# Patient Record
Sex: Female | Born: 1960
Health system: Southern US, Community
[De-identification: ages and names within clinical notes are randomized; demographics above are authoritative.]

## PROBLEM LIST (undated history)

## (undated) HISTORY — PX: TUBAL LIGATION: SHX77

## (undated) HISTORY — PX: GALLBLADDER SURGERY: SHX652

---

## 2002-05-31 ENCOUNTER — Ambulatory Visit (HOSPITAL_BASED_OUTPATIENT_CLINIC_OR_DEPARTMENT_OTHER): Admission: RE | Admit: 2002-05-31 | Discharge: 2002-05-31 | Payer: Self-pay | Admitting: General Surgery

## 2013-01-19 DIAGNOSIS — Z0289 Encounter for other administrative examinations: Secondary | ICD-10-CM

## 2013-01-26 ENCOUNTER — Telehealth: Payer: Self-pay | Admitting: Nurse Practitioner

## 2013-01-29 ENCOUNTER — Telehealth: Payer: Self-pay | Admitting: Nurse Practitioner

## 2013-01-29 NOTE — Telephone Encounter (Signed)
Derry Skill is working on

## 2013-02-01 NOTE — Telephone Encounter (Signed)
Heather Mcclain  finiched and pt picked up on 02/01/13

## 2013-02-26 ENCOUNTER — Telehealth: Payer: Self-pay | Admitting: Nurse Practitioner

## 2013-03-01 NOTE — Telephone Encounter (Signed)
Mmm has chart

## 2013-03-01 NOTE — Telephone Encounter (Signed)
Mmm advise 

## 2013-03-01 NOTE — Telephone Encounter (Signed)
Cannot find- what is patient name- is it [place]?- Pulled [facility]aya chart and there is no form on it.

## 2013-12-01 ENCOUNTER — Ambulatory Visit (INDEPENDENT_AMBULATORY_CARE_PROVIDER_SITE_OTHER): Payer: BC Managed Care – PPO | Admitting: Family Medicine

## 2013-12-01 ENCOUNTER — Telehealth: Payer: Self-pay | Admitting: Nurse Practitioner

## 2013-12-01 ENCOUNTER — Encounter: Payer: Self-pay | Admitting: Family Medicine

## 2013-12-01 VITALS — BP 128/86 | HR 59 | Temp 98.0°F | Ht 66.0 in | Wt 177.0 lb

## 2013-12-01 DIAGNOSIS — R1011 Right upper quadrant pain: Secondary | ICD-10-CM

## 2013-12-01 DIAGNOSIS — R1031 Right lower quadrant pain: Secondary | ICD-10-CM

## 2013-12-01 LAB — POCT CBC
Granulocyte percent: 81.6 %G — AB (ref 37–80)
HCT, POC: 41.9 % (ref 37.7–47.9)
Hemoglobin: 13.3 g/dL (ref 12.2–16.2)
Lymph, poc: 1.8 (ref 0.6–3.4)
MCH, POC: 26 pg — AB (ref 27–31.2)
MCHC: 31.7 g/dL — AB (ref 31.8–35.4)
MCV: 81.8 fL (ref 80–97)
MPV: 8.9 fL (ref 0–99.8)
POC Granulocyte: 9.4 — AB (ref 2–6.9)
POC LYMPH PERCENT: 15.7 %L (ref 10–50)
Platelet Count, POC: 168 10*3/uL (ref 142–424)
RBC: 5.1 M/uL (ref 4.04–5.48)
RDW, POC: 14.7 %
WBC: 11.5 10*3/uL — AB (ref 4.6–10.2)

## 2013-12-01 LAB — POCT URINALYSIS DIPSTICK
Bilirubin, UA: NEGATIVE
Glucose, UA: NEGATIVE
Ketones, UA: NEGATIVE
Leukocytes, UA: NEGATIVE
Nitrite, UA: NEGATIVE
Spec Grav, UA: <=1.005
Urobilinogen, UA: NEGATIVE
pH, UA: 8.5

## 2013-12-01 LAB — POCT UA - MICROSCOPIC ONLY
Casts, Ur, LPF, POC: NEGATIVE
Crystals, Ur, HPF, POC: NEGATIVE
Mucus, UA: NEGATIVE
Yeast, UA: NEGATIVE

## 2013-12-01 NOTE — Telephone Encounter (Signed)
appt given for 5:45 but advised her to come on in case we need to do xray

## 2013-12-01 NOTE — Progress Notes (Signed)
Subjective:    Patient ID: Heather Mcclain, female    DOB: 04-Oct-1960, 53 y.o.   MRN: [id number]  HPI C/o right upper quadrant abdominal pain that has been severe today.  She has had nausea with this but has not vomitted.  She has been having intermittant TUQ abdominal discomfort for the last few months.  She is having pain on a severe rating.  C/o nausea and denies dysuria   Review of Systems C/o abdominal pain and nausea No chest pain, SOB, HA, dizziness, vision change, N/V, diarrhea, constipation, dysuria, urinary urgency or frequency, myalgias, arthralgias or rash.     Objective:   Physical Exam   Vital signs noted  Well developed well nourished female.  HEENT - Head atraumatic Normocephalic                Eyes - PERRLA, Conjuctiva - clear Sclera- Clear EOMI                Ears - EAC's Wnl TM's Wnl Gross Hearing WNL                Throat - oropharanx wnl Respiratory - Lungs CTA bilateral Cardiac - RRR S1 and S2 without murmur GI - Abdomen soft tender right upper quadrant and positive murphy's and bowel sounds active x 4 Extremities - No edema. Neuro - Grossly intact. MS - Negative CVS tenderness bilateral    Results for orders placed in visit on 12/01/13  POCT UA - MICROSCOPIC ONLY      Result Value Ref Range   WBC, Ur, HPF, POC 5-8     RBC, urine, microscopic 3-5     Bacteria, U Microscopic moderate     Mucus, UA negative     Epithelial cells, urine per micros few     Crystals, Ur, HPF, POC negative     Casts, Ur, LPF, POC negative     Yeast, UA negative    POCT URINALYSIS DIPSTICK      Result Value Ref Range   Color, UA gold     Clarity, UA clear     Glucose, UA negative     Bilirubin, UA negative     Ketones, UA negative     Spec Grav, UA <=1.005     Blood, UA trace     pH, UA 8.5     Protein, UA 3+     Urobilinogen, UA negative     Nitrite, UA negative     Leukocytes, UA Negative    POCT CBC      Result Value Ref Range   WBC 11.5 (*) 4.6 -  10.2 K/uL   Lymph, poc 1.8  0.6 - 3.4   POC LYMPH PERCENT 15.7  10 - 50 %L   POC Granulocyte 9.4 (*) 2 - 6.9   Granulocyte percent 81.6 (*) 37 - 80 %G   RBC 5.1  4.04 - 5.48 M/uL   Hemoglobin 13.3  12.2 - 16.2 g/dL   HCT, POC 94.5  85.9 - 47.9 %   MCV 81.8  80 - 97 fL   MCH, POC 26.0 (*) 27 - 31.2 pg   MCHC 31.7 (*) 31.8 - 35.4 g/dL   RDW, POC 29.2     Platelet Count, POC 168.0  142 - 424 K/uL   MPV 8.9  0 - 99.8 fL   Assessment & Plan:  Pain, abdominal, RLQ - Plan: POCT UA - Microscopic Only, POCT urinalysis dipstick, POCT CBC  RUQ abdominal  pain - R/o cholycystitis and she will be sent to ED and [facility]  She is advised to follow up prn  Deatra CanterWilliam J Oxford FNP

## 2013-12-02 ENCOUNTER — Telehealth: Payer: Self-pay | Admitting: Family Medicine

## 2013-12-02 ENCOUNTER — Other Ambulatory Visit: Payer: Self-pay | Admitting: Family Medicine

## 2013-12-02 DIAGNOSIS — K8020 Calculus of gallbladder without cholecystitis without obstruction: Secondary | ICD-10-CM

## 2013-12-02 NOTE — Telephone Encounter (Signed)
Called [facility] they are sending U/S and i will put it on your desk

## 2014-01-20 DIAGNOSIS — Z9049 Acquired absence of other specified parts of digestive tract: Secondary | ICD-10-CM | POA: Insufficient documentation

## 2014-01-20 DIAGNOSIS — K8020 Calculus of gallbladder without cholecystitis without obstruction: Secondary | ICD-10-CM | POA: Insufficient documentation

## 2014-06-15 ENCOUNTER — Ambulatory Visit (INDEPENDENT_AMBULATORY_CARE_PROVIDER_SITE_OTHER): Payer: BC Managed Care – PPO | Admitting: Nurse Practitioner

## 2014-06-15 ENCOUNTER — Encounter: Payer: Self-pay | Admitting: Nurse Practitioner

## 2014-06-15 VITALS — BP 134/84 | HR 67 | Temp 97.5°F | Ht 66.0 in | Wt 180.0 lb

## 2014-06-15 DIAGNOSIS — B353 Tinea pedis: Secondary | ICD-10-CM

## 2014-06-15 MED ORDER — TERBINAFINE HCL 1 % EX CREA: 1.0000 "application " | TOPICAL_CREAM | Freq: Two times a day (BID) | CUTANEOUS | Status: DC

## 2014-06-15 NOTE — Progress Notes (Signed)
   Subjective:    Patient ID: Heather Mcclain, female    DOB: 1960/11/19, 53 y.o.   MRN: [id number]  HPI Patient in today c/o left foot burning and itching- red and peeling- worse when she is walking on it.    Review of Systems  Constitutional: Negative.   HENT: Negative.   Respiratory: Negative.   Cardiovascular: Negative.   Genitourinary: Negative.   Neurological: Negative.   Psychiatric/Behavioral: Negative.   All other systems reviewed and are negative.      Objective:   Physical Exam  Constitutional: She is oriented to person, place, and time. She appears well-developed and well-nourished.  Cardiovascular: Normal rate, regular rhythm and normal heart sounds.   Pulmonary/Chest: Effort normal and breath sounds normal.  Musculoskeletal: Normal range of motion.  Neurological: She is alert and oriented to person, place, and time.  Skin: Skin is warm.  erythemaytous peeling rash between 2nd and 3rd toe left foot.   BP 134/84 mmHg  Pulse 67  Temp(Src) 97.5 F (36.4 C) (Oral)  Ht 5\' 6"  (1.676 m)  Wt 180 lb (81.647 kg)  BMI 29.07 kg/m2        Assessment & Plan:   1. Tinea pedis of left foot    Meds ordered this encounter  Medications  . terbinafine (LAMISIL AT) 1 % cream    Sig: Apply 1 application topically 2 (two) times daily.    Dispense:  30 g    Refill:  0    Order Specific Question:  Supervising Provider    Answer:  Ernestina PennaMOORE, DONALD W [[id number]   Keep area clean and dry Avoid scratching RTO prn   Mary-Margaret Daphine DeutscherMartin, FNP

## 2014-06-15 NOTE — Patient Instructions (Signed)

## 2014-06-28 DIAGNOSIS — Z0289 Encounter for other administrative examinations: Secondary | ICD-10-CM

## 2015-01-04 ENCOUNTER — Encounter: Payer: Self-pay | Admitting: Nurse Practitioner

## 2015-02-10 ENCOUNTER — Encounter: Payer: Self-pay | Admitting: Physician Assistant

## 2015-02-10 ENCOUNTER — Ambulatory Visit (INDEPENDENT_AMBULATORY_CARE_PROVIDER_SITE_OTHER): Payer: BLUE CROSS/BLUE SHIELD | Admitting: Physician Assistant

## 2015-02-10 VITALS — BP 136/88 | HR 72 | Temp 98.7°F | Ht 66.0 in | Wt 181.0 lb

## 2015-02-10 DIAGNOSIS — L089 Local infection of the skin and subcutaneous tissue, unspecified: Secondary | ICD-10-CM | POA: Diagnosis not present

## 2015-02-10 DIAGNOSIS — Z0000 Encounter for general adult medical examination without abnormal findings: Secondary | ICD-10-CM | POA: Diagnosis not present

## 2015-02-10 DIAGNOSIS — L918 Other hypertrophic disorders of the skin: Secondary | ICD-10-CM

## 2015-02-10 NOTE — Progress Notes (Signed)
   Subjective:    Patient ID: Heather Mcclain, female    DOB: Dec 27, 1960, 54 y.o.   MRN: [id number]  HPI 54 y/o female with no comorbidities presents for physical for her work. She has all of her labs done at work. She states that all of her labs were WNL in Sept 2015 but she is unsure of labs that were performed.   She is up to date on mammogram - 2016  She is utd on colonoscopy - 3 years ago  She has pap smear scheduled with Dr. Alveria Apley in Nov 2016.     Review of Systems  Constitutional: Negative.   HENT: Negative.   Eyes: Negative.   Respiratory: Negative.   Cardiovascular: Negative.   Gastrointestinal: Negative.   Skin:       Skin tag on neck, irritated by clothing   Neurological: Negative.   Psychiatric/Behavioral: Negative.        Objective:   Physical Exam  Constitutional: She is oriented to person, place, and time. She appears well-developed and well-nourished.  HENT:  Head: Normocephalic and atraumatic.  Right Ear: External ear normal.  Left Ear: External ear normal.  Cardiovascular: Normal rate, regular rhythm, normal heart sounds and intact distal pulses.  Exam reveals no gallop and no friction rub.   No murmur heard. Pulmonary/Chest: Effort normal and breath sounds normal. No respiratory distress. She has no wheezes. She has no rales. She exhibits no tenderness.  Abdominal: Soft. Bowel sounds are normal. She exhibits no distension and no mass. There is no tenderness. There is no rebound and no guarding.  Musculoskeletal: Normal range of motion. She exhibits no edema or tenderness.  Neurological: She is alert and oriented to person, place, and time. No cranial nerve deficit. Coordination normal.  Skin: No erythema.  Keloid scar on chest   Psychiatric: She has a normal mood and affect. Her behavior is normal. Judgment and thought content normal.  Nursing note and vitals reviewed.         Assessment & Plan:  1. Physical exam, annual  - Follow up  with Dr. Windy Fast for pap smear - Mammogram in 1 year - Colonoscopy in 7 years - bone density  - Bring most recent labs to office or have faxed over  2. Inflamed skin tag <.5cm inflamed skin tag removed. Was not sent for biopsy d/t benign appearing nature. Monsels and bandage applied post procedure  RTO 1 year or sooner if needed   Tiffany A. Chauncey Reading PA-C

## 2015-07-25 DIAGNOSIS — Z029 Encounter for administrative examinations, unspecified: Secondary | ICD-10-CM

## 2016-07-09 DIAGNOSIS — Z029 Encounter for administrative examinations, unspecified: Secondary | ICD-10-CM

## 2017-01-16 ENCOUNTER — Encounter (INDEPENDENT_AMBULATORY_CARE_PROVIDER_SITE_OTHER): Payer: Self-pay

## 2017-01-16 ENCOUNTER — Ambulatory Visit (INDEPENDENT_AMBULATORY_CARE_PROVIDER_SITE_OTHER): Payer: BLUE CROSS/BLUE SHIELD | Admitting: Nurse Practitioner

## 2017-01-16 ENCOUNTER — Encounter: Payer: Self-pay | Admitting: Nurse Practitioner

## 2017-01-16 VITALS — BP 130/87 | HR 73 | Temp 97.5°F | Ht 66.0 in | Wt 194.0 lb

## 2017-01-16 DIAGNOSIS — Z1159 Encounter for screening for other viral diseases: Secondary | ICD-10-CM

## 2017-01-16 DIAGNOSIS — E559 Vitamin D deficiency, unspecified: Secondary | ICD-10-CM

## 2017-01-16 DIAGNOSIS — Z0000 Encounter for general adult medical examination without abnormal findings: Secondary | ICD-10-CM

## 2017-01-16 DIAGNOSIS — Z1211 Encounter for screening for malignant neoplasm of colon: Secondary | ICD-10-CM

## 2017-01-16 LAB — URINALYSIS, COMPLETE
Bilirubin, UA: NEGATIVE
Glucose, UA: NEGATIVE
Ketones, UA: NEGATIVE
Leukocytes, UA: NEGATIVE
NITRITE UA: NEGATIVE
PH UA: 5.5 (ref 5.0–7.5)
PROTEIN UA: NEGATIVE
Specific Gravity, UA: <1.005 — ABNORMAL LOW (ref 1.005–1.030)
UUROB: 0.2 mg/dL (ref 0.2–1.0)

## 2017-01-16 LAB — MICROSCOPIC EXAMINATION

## 2017-01-16 MED ORDER — VITAMIN D (ERGOCALCIFEROL) 1.25 MG (50000 UNIT) PO CAPS: 50000.0000 [IU] | ORAL_CAPSULE | ORAL | 1 refills | Status: DC

## 2017-01-16 NOTE — Patient Instructions (Signed)
Bone Health Bones protect organs, store calcium, and anchor muscles. Good health habits, such as eating nutritious foods and exercising regularly, are important for maintaining healthy bones. They can also help to prevent a condition that causes bones to lose density and become weak and brittle (osteoporosis). Why is bone mass important? Bone mass refers to the amount of bone tissue that you have. The higher your bone mass, the stronger your bones. An important step toward having healthy bones throughout life is to have strong and dense bones during childhood. A young adult who has a high bone mass is more likely to have a high bone mass later in life. Bone mass at its greatest it is called peak bone mass. A large decline in bone mass occurs in older adults. In women, it occurs about the time of menopause. During this time, it is important to practice good health habits, because if more bone is lost than what is replaced, the bones will become less healthy and more likely to break (fracture). If you find that you have a low bone mass, you may be able to prevent osteoporosis or further bone loss by changing your diet and lifestyle. How can I find out if my bone mass is low? Bone mass can be measured with an X-ray test that is called a bone mineral density (BMD) test. This test is recommended for all women who are age 65 or older. It may also be recommended for men who are age 70 or older, or for people who are more likely to develop osteoporosis due to:  Having bones that break easily.  Having a long-term disease that weakens bones, such as kidney disease or rheumatoid arthritis.  Having menopause earlier than normal.  Taking medicine that weakens bones, such as steroids, thyroid hormones, or hormone treatment for breast cancer or prostate cancer.  Smoking.  Drinking three or more alcoholic drinks each day.  What are the nutritional recommendations for healthy bones? To have healthy bones, you  need to get enough of the right minerals and vitamins. Most nutrition experts recommend getting these nutrients from the foods that you eat. Nutritional recommendations vary from person to person. Ask your health care provider what is healthy for you. Here are some general guidelines. Calcium Recommendations Calcium is the most important (essential) mineral for bone health. Most people can get enough calcium from their diet, but supplements may be recommended for people who are at risk for osteoporosis. Good sources of calcium include:  Dairy products, such as low-fat or nonfat milk, cheese, and yogurt.  Dark green leafy vegetables, such as bok choy and broccoli.  Calcium-fortified foods, such as orange juice, cereal, bread, soy beverages, and tofu products.  Nuts, such as almonds.  Follow these recommended amounts for daily calcium intake:  Children, age 1?3: 700 mg.  Children, age 4?8: 1,000 mg.  Children, age 9?13: 1,300 mg.  Teens, age 14?18: 1,300 mg.  Adults, age 19?50: 1,000 mg.  Adults, age 51?70: ? Men: 1,000 mg. ? Women: 1,200 mg.  Adults, age 71 or older: 1,200 mg.  Pregnant and breastfeeding females: ? Teens: 1,300 mg. ? Adults: 1,000 mg.  Vitamin D Recommendations Vitamin D is the most essential vitamin for bone health. It helps the body to absorb calcium. Sunlight stimulates the skin to make vitamin D, so be sure to get enough sunlight. If you live in a cold climate or you do not get outside often, your health care provider may recommend that you take vitamin   D supplements. Good sources of vitamin D in your diet include:  Egg yolks.  Saltwater fish.  Milk and cereal fortified with vitamin D.  Follow these recommended amounts for daily vitamin D intake:  Children and teens, age 1?18: 600 international units.  Adults, age 50 or younger: 400-800 international units.  Adults, age 51 or older: 800-1,000 international units.  Other Nutrients Other nutrients  for bone health include:  Phosphorus. This mineral is found in meat, poultry, dairy foods, nuts, and legumes. The recommended daily intake for adult men and adult women is 700 mg.  Magnesium. This mineral is found in seeds, nuts, dark green vegetables, and legumes. The recommended daily intake for adult men is 400?420 mg. For adult women, it is 310?320 mg.  Vitamin K. This vitamin is found in green leafy vegetables. The recommended daily intake is 120 mg for adult men and 90 mg for adult women.  What type of physical activity is best for building and maintaining healthy bones? Weight-bearing and strength-building activities are important for building and maintaining peak bone mass. Weight-bearing activities cause muscles and bones to work against gravity. Strength-building activities increases muscle strength that supports bones. Weight-bearing and muscle-building activities include:  Walking and hiking.  Jogging and running.  Dancing.  Gym exercises.  Lifting weights.  Tennis and racquetball.  Climbing stairs.  Aerobics.  Adults should get at least 30 minutes of moderate physical activity on most days. Children should get at least 60 minutes of moderate physical activity on most days. Ask your health care provide what type of exercise is best for you. Where can I find more information? For more information, check out the following websites:  National Osteoporosis Foundation: http://nof.org/learn/basics  National Institutes of Health: http://www.niams.nih.gov/Health_Info/Bone/Bone_Health/bone_health_for_life.asp  This information is not intended to replace advice given to you by your health care provider. Make sure you discuss any questions you have with your health care provider. Document Released: 09/14/2003 Document Revised: 01/12/2016 Document Reviewed: 06/29/2014 Elsevier Interactive Patient Education  2018 Elsevier Inc.  

## 2017-01-16 NOTE — Progress Notes (Signed)
   Subjective:    Patient ID: Kristena Wilhelmi, female    DOB: Sep 12, 1960, 56 y.o.   MRN: [id number]  HPI Patient is in the office for her comprehensive physical exam.  Patient does not wish to have a pap today. She has no current medical problems and is on no meds.   Review of Systems  Constitutional: Negative for activity change, appetite change and fatigue.  Respiratory: Negative for cough, shortness of breath and wheezing.   Cardiovascular: Negative for chest pain and palpitations.  Gastrointestinal: Negative for abdominal distention and abdominal pain.  Skin: Negative for rash.  Neurological: Negative for dizziness, light-headedness and headaches.  All other systems reviewed and are negative.      Objective:   Physical Exam  Constitutional: She is oriented to person, place, and time. She appears well-developed and well-nourished. No distress.  HENT:  Head: Normocephalic and atraumatic.  Right Ear: External ear normal.  Left Ear: External ear normal.  Nose: Nose normal.  Mouth/Throat: Oropharynx is clear and moist.  Eyes: Pupils are equal, round, and reactive to light. Conjunctivae and EOM are normal.  Neck: Normal range of motion. Neck supple. No JVD present. No thyromegaly present.  Cardiovascular: Normal rate, regular rhythm, normal heart sounds and intact distal pulses.  Exam reveals no gallop and no friction rub.   No murmur heard. Pulmonary/Chest: Effort normal and breath sounds normal. No respiratory distress. She has no wheezes.  Abdominal: Soft. Bowel sounds are normal. She exhibits no distension and no mass. There is no tenderness.  Genitourinary: Vagina normal.  Musculoskeletal: Normal range of motion. She exhibits no edema.  Lymphadenopathy:    She has no cervical adenopathy.  Neurological: She is alert and oriented to person, place, and time. She has normal reflexes.  Skin: Skin is warm and dry. No rash noted. No erythema.  Psychiatric: She has a normal  mood and affect. Her behavior is normal. Judgment and thought content normal.   BP 130/87   Pulse 73   Temp (!) 97.5 F (36.4 C) (Oral)   Ht '5\' 6"'$  (1.676 m)   Wt 194 lb (88 kg)   BMI 31.31 kg/m      Assessment & Plan:   1. Annual physical exam   2. Vitamin D deficiency   3. Need for hepatitis C screening test   4. Screening for colon cancer    Meds ordered this encounter  Medications  . Vitamin D, Ergocalciferol, (DRISDOL) 50000 units CAPS capsule    Sig: Take 1 capsule (50,000 Units total) by mouth every 7 (seven) days.    Dispense:  12 capsule    Refill:  1    Order Specific Question:   Supervising Provider    Answer:   VINCENT, CAROL L [[id number]]   Orders Placed This Encounter  Procedures  . Fecal occult blood, imunochemical    Standing Status:   Future    Standing Expiration Date:   07/19/2017  . Urinalysis, Complete  . CBC with Differential/Platelet  . CMP14+EGFR  . Lipid panel  . Thyroid Panel With TSH  . VITAMIN D 25 Hydroxy (Vit-D Deficiency, Fractures)  . Hepatitis C antibody   Labs pedning Diet and exercise encouraged Follow up in 3 months  [place], FNP

## 2017-01-17 ENCOUNTER — Other Ambulatory Visit: Payer: Self-pay | Admitting: Nurse Practitioner

## 2017-01-17 LAB — CBC WITH DIFFERENTIAL/PLATELET
BASOS: 0 %
Basophils Absolute: 0 10*3/uL (ref 0.0–0.2)
EOS (ABSOLUTE): 0.1 10*3/uL (ref 0.0–0.4)
EOS: 2 %
HEMATOCRIT: 41.5 % (ref 34.0–46.6)
Hemoglobin: 13.8 g/dL (ref 11.1–15.9)
IMMATURE GRANS (ABS): 0 10*3/uL (ref 0.0–0.1)
IMMATURE GRANULOCYTES: 0 %
LYMPHS: 44 %
Lymphocytes Absolute: 2.5 10*3/uL (ref 0.7–3.1)
MCH: 26.9 pg (ref 26.6–33.0)
MCHC: 33.3 g/dL (ref 31.5–35.7)
MCV: 81 fL (ref 79–97)
MONOCYTES: 11 %
MONOS ABS: 0.6 10*3/uL (ref 0.1–0.9)
NEUTROS PCT: 43 %
Neutrophils Absolute: 2.4 10*3/uL (ref 1.4–7.0)
PLATELETS: 210 10*3/uL (ref 150–379)
RBC: 5.13 x10E6/uL (ref 3.77–5.28)
RDW: 16 % — AB (ref 12.3–15.4)
WBC: 5.6 10*3/uL (ref 3.4–10.8)

## 2017-01-17 LAB — THYROID PANEL WITH TSH
Free Thyroxine Index: 1.7 (ref 1.2–4.9)
T3 Uptake Ratio: 23 % — ABNORMAL LOW (ref 24–39)
T4, Total: 7.6 ug/dL (ref 4.5–12.0)
TSH: 2.05 u[IU]/mL (ref 0.450–4.500)

## 2017-01-17 LAB — VITAMIN D 25 HYDROXY (VIT D DEFICIENCY, FRACTURES): Vit D, 25-Hydroxy: 21.3 ng/mL — ABNORMAL LOW (ref 30.0–100.0)

## 2017-01-17 LAB — CMP14+EGFR
ALT: 22 IU/L (ref 0–32)
AST: 22 IU/L (ref 0–40)
Albumin/Globulin Ratio: 1.6 (ref 1.2–2.2)
Albumin: 4.4 g/dL (ref 3.5–5.5)
Alkaline Phosphatase: 106 IU/L (ref 39–117)
BUN/Creatinine Ratio: 9 (ref 9–23)
BUN: 7 mg/dL (ref 6–24)
Bilirubin Total: 0.3 mg/dL (ref 0.0–1.2)
CALCIUM: 10.1 mg/dL (ref 8.7–10.2)
CO2: 27 mmol/L (ref 20–29)
CREATININE: 0.82 mg/dL (ref 0.57–1.00)
Chloride: 103 mmol/L (ref 96–106)
GFR, EST AFRICAN AMERICAN: 93 mL/min/{1.73_m2} (ref >59)
GFR, EST NON AFRICAN AMERICAN: 81 mL/min/{1.73_m2} (ref >59)
GLOBULIN, TOTAL: 2.8 g/dL (ref 1.5–4.5)
Glucose: 88 mg/dL (ref 65–99)
Potassium: 5 mmol/L (ref 3.5–5.2)
Sodium: 144 mmol/L (ref 134–144)
TOTAL PROTEIN: 7.2 g/dL (ref 6.0–8.5)

## 2017-01-17 LAB — LIPID PANEL
CHOL/HDL RATIO: 2.1 ratio (ref 0.0–4.4)
Cholesterol, Total: 184 mg/dL (ref 100–199)
HDL: 87 mg/dL (ref >39)
LDL CALC: 79 mg/dL (ref 0–99)
TRIGLYCERIDES: 90 mg/dL (ref 0–149)
VLDL Cholesterol Cal: 18 mg/dL (ref 5–40)

## 2017-01-17 LAB — HEPATITIS C ANTIBODY

## 2017-06-24 ENCOUNTER — Other Ambulatory Visit: Payer: Self-pay | Admitting: Nurse Practitioner

## 2017-06-24 DIAGNOSIS — E559 Vitamin D deficiency, unspecified: Secondary | ICD-10-CM

## 2017-06-24 NOTE — Telephone Encounter (Signed)
Last Vit D 01/16/17  21.3

## 2017-07-09 DIAGNOSIS — Z0289 Encounter for other administrative examinations: Secondary | ICD-10-CM

## 2017-08-14 NOTE — Progress Notes (Signed)
LMTCB

## 2017-09-30 ENCOUNTER — Other Ambulatory Visit: Payer: Self-pay | Admitting: Nurse Practitioner

## 2017-09-30 DIAGNOSIS — E559 Vitamin D deficiency, unspecified: Secondary | ICD-10-CM

## 2017-12-16 LAB — HM MAMMOGRAPHY

## 2017-12-28 ENCOUNTER — Other Ambulatory Visit: Payer: Self-pay | Admitting: Nurse Practitioner

## 2017-12-28 DIAGNOSIS — E559 Vitamin D deficiency, unspecified: Secondary | ICD-10-CM

## 2018-02-10 NOTE — Progress Notes (Signed)
In Care Everywhere  

## 2019-06-24 ENCOUNTER — Other Ambulatory Visit: Payer: Self-pay

## 2019-06-28 ENCOUNTER — Ambulatory Visit: Payer: BLUE CROSS/BLUE SHIELD

## 2019-06-30 ENCOUNTER — Other Ambulatory Visit: Payer: Self-pay

## 2019-06-30 ENCOUNTER — Encounter: Payer: Self-pay | Admitting: Family Medicine

## 2019-06-30 ENCOUNTER — Ambulatory Visit (INDEPENDENT_AMBULATORY_CARE_PROVIDER_SITE_OTHER): Payer: BC Managed Care – PPO | Admitting: Family Medicine

## 2019-06-30 VITALS — BP 138/76 | HR 63 | Temp 98.0°F | Resp 20 | Ht 66.0 in | Wt 185.0 lb

## 2019-06-30 DIAGNOSIS — Z6829 Body mass index (BMI) 29.0-29.9, adult: Secondary | ICD-10-CM | POA: Diagnosis not present

## 2019-06-30 DIAGNOSIS — H6692 Otitis media, unspecified, left ear: Secondary | ICD-10-CM | POA: Diagnosis not present

## 2019-06-30 DIAGNOSIS — E559 Vitamin D deficiency, unspecified: Secondary | ICD-10-CM

## 2019-06-30 DIAGNOSIS — R8271 Bacteriuria: Secondary | ICD-10-CM

## 2019-06-30 DIAGNOSIS — Z0000 Encounter for general adult medical examination without abnormal findings: Secondary | ICD-10-CM

## 2019-06-30 DIAGNOSIS — Z0001 Encounter for general adult medical examination with abnormal findings: Secondary | ICD-10-CM

## 2019-06-30 LAB — URINALYSIS, COMPLETE
Bilirubin, UA: NEGATIVE
Glucose, UA: NEGATIVE
Ketones, UA: NEGATIVE
Leukocytes,UA: NEGATIVE
Nitrite, UA: NEGATIVE
Protein,UA: NEGATIVE
Specific Gravity, UA: 1.025 (ref 1.005–1.030)
Urobilinogen, Ur: 0.2 mg/dL (ref 0.2–1.0)
pH, UA: 5.5 (ref 5.0–7.5)

## 2019-06-30 LAB — MICROSCOPIC EXAMINATION: Renal Epithel, UA: NONE SEEN /hpf

## 2019-06-30 MED ORDER — VITAMIN D (ERGOCALCIFEROL) 1.25 MG (50000 UNIT) PO CAPS: 50000.0000 [IU] | ORAL_CAPSULE | ORAL | 0 refills | Status: DC

## 2019-06-30 MED ORDER — CEFDINIR 300 MG PO CAPS: 300.0000 mg | ORAL_CAPSULE | Freq: Two times a day (BID) | ORAL | 0 refills | Status: DC

## 2019-06-30 NOTE — Progress Notes (Signed)
Subjective:  Patient ID: Heather Mcclain, female    DOB: July 11, 1960, 58 y.o.   MRN: [id number]  Patient Care Team: Chevis Pretty, [place] as PCP - General (Nurse Practitioner)   Chief Complaint:  Annual Exam (no pap )   HPI: Heather Mcclain is a 58 y.o. female presenting on 06/30/2019 for Annual Exam (no pap )   Pt presents today for her annual physical exam. Pt states she has been doing well overall. States she is only taking over the counter Vit D repletion therapy on a regular basis. She was on once weekly oral repletion but has run out and did not get this refilled. She has had her mammogram and PAP completed. She states she had her colonoscopy completed at [facility] at age 75 and it was normal. She states she does have a keloid scar to her chest. States this occurred after a cyst removal. She has not been putting anything on the scar. She states she has pain in her left ear. States this started about 3 weeks ago and is getting worse. No fever, chills, or drainage. She does report "popping" sounds at times.     Relevant past medical, surgical, family, and social history reviewed and updated as indicated.  Allergies and medications reviewed and updated. Date reviewed: Chart in Epic.   History reviewed. No pertinent past medical history.  History reviewed. No pertinent surgical history.  Social History   Socioeconomic History  . Marital status: Married    Spouse name: Not on file  . Number of children: Not on file  . Years of education: Not on file  . Highest education level: Not on file  Occupational History  . Not on file  Tobacco Use  . Smoking status: Never Smoker  . Smokeless tobacco: Never Used  Substance and Sexual Activity  . Alcohol use: No  . Drug use: No  . Sexual activity: Not on file  Other Topics Concern  . Not on file  Social History Narrative  . Not on file   Social Determinants of Health   Financial Resource Strain:   . Difficulty  of Paying Living Expenses: Not on file  Food Insecurity:   . Worried About Charity fundraiser in the Last Year: Not on file  . Ran Out of Food in the Last Year: Not on file  Transportation Needs:   . Lack of Transportation (Medical): Not on file  . Lack of Transportation (Non-Medical): Not on file  Physical Activity:   . Days of Exercise per Week: Not on file  . Minutes of Exercise per Session: Not on file  Stress:   . Feeling of Stress : Not on file  Social Connections:   . Frequency of Communication with Friends and Family: Not on file  . Frequency of Social Gatherings with Friends and Family: Not on file  . Attends Religious Services: Not on file  . Active Member of Clubs or Organizations: Not on file  . Attends Archivist Meetings: Not on file  . Marital Status: Not on file  Intimate Partner Violence:   . Fear of Current or Ex-Partner: Not on file  . Emotionally Abused: Not on file  . Physically Abused: Not on file  . Sexually Abused: Not on file    Outpatient Encounter Medications as of 06/30/2019  Medication Sig  . loratadine (CLARITIN) 10 MG tablet Take 10 mg by mouth daily.  . Vitamin D, Ergocalciferol, (DRISDOL) 1.25 MG (50000 UT) CAPS capsule  Take 1 capsule (50,000 Units total) by mouth every 7 (seven) days.  . [DISCONTINUED] cholecalciferol (VITAMIN D3) 25 MCG (1000 UT) tablet Take 1,000 Units by mouth daily.  . [DISCONTINUED] Vitamin D, Ergocalciferol, (DRISDOL) 50000 units CAPS capsule TAKE 1 CAPSULE (50,000 UNITS TOTAL) BY MOUTH EVERY 7 (SEVEN) DAYS.  Marland Kitchen cefdinir (OMNICEF) 300 MG capsule Take 1 capsule (300 mg total) by mouth 2 (two) times daily. 1 po BID   No facility-administered encounter medications on file as of 06/30/2019.    Allergies  Allergen Reactions  . Penicillins Rash    Review of Systems  Constitutional: Negative for activity change, appetite change, chills, diaphoresis, fatigue, fever and unexpected weight change.  HENT: Positive for  ear pain and tinnitus. Negative for postnasal drip, rhinorrhea, sinus pressure and sinus pain.   Eyes: Negative.  Negative for photophobia and visual disturbance.  Respiratory: Negative for cough, chest tightness and shortness of breath.   Cardiovascular: Negative for chest pain, palpitations and leg swelling.  Gastrointestinal: Negative for abdominal pain, blood in stool, constipation, diarrhea, nausea and vomiting.  Endocrine: Negative.  Negative for polydipsia, polyphagia and polyuria.  Genitourinary: Positive for frequency. Negative for decreased urine volume, difficulty urinating, dysuria, enuresis, flank pain, hematuria, pelvic pain, urgency, vaginal bleeding, vaginal discharge and vaginal pain.  Musculoskeletal: Negative for arthralgias and myalgias.  Skin: Negative.   Allergic/Immunologic: Negative.   Neurological: Negative for dizziness, tremors, seizures, syncope, facial asymmetry, speech difficulty, weakness, light-headedness, numbness and headaches.  Hematological: Negative.   Psychiatric/Behavioral: Negative for agitation, confusion, hallucinations, sleep disturbance and suicidal ideas.  All other systems reviewed and are negative.       Objective:  BP 138/76 (Cuff Size: Normal)   Pulse 63   Temp 98 F (36.7 C)   Resp 20   Ht _0  (1.676 m)   Wt 185 lb (83.9 kg)   SpO2 98%   BMI 29.86 kg/m    Wt Readings from Last 3 Encounters:  06/30/19 185 lb (83.9 kg)  01/16/17 194 lb (88 kg)  02/10/15 181 lb (82.1 kg)    Physical Exam Vitals and nursing note reviewed.  Constitutional:      General: She is not in acute distress.    Appearance: Normal appearance. She is well-developed, well-groomed and overweight. She is not ill-appearing, toxic-appearing or diaphoretic.  HENT:     Head: Normocephalic and atraumatic.     Jaw: There is normal jaw occlusion.     Right Ear: Hearing, tympanic membrane, ear canal and external ear normal. There is no impacted cerumen.     Left  Ear: Hearing, ear canal and external ear normal. There is no impacted cerumen. Tympanic membrane is erythematous and bulging. Tympanic membrane is not perforated.     Nose: Nose normal.     Mouth/Throat:     Lips: Pink.     Mouth: Mucous membranes are moist.     Pharynx: Oropharynx is clear. Uvula midline.  Eyes:     General: Lids are normal.     Extraocular Movements: Extraocular movements intact.     Conjunctiva/sclera: Conjunctivae normal.     Pupils: Pupils are equal, round, and reactive to light.  Neck:     Thyroid: No thyroid mass, thyromegaly or thyroid tenderness.     Vascular: No carotid bruit or JVD.     Trachea: Trachea and phonation normal.  Cardiovascular:     Rate and Rhythm: Normal rate and regular rhythm.     Chest Wall: PMI is not  displaced.     Pulses: Normal pulses.     Heart sounds: Normal heart sounds. No murmur. No friction rub. No gallop.   Pulmonary:     Effort: Pulmonary effort is normal. No respiratory distress.     Breath sounds: Normal breath sounds. No wheezing.  Abdominal:     General: Bowel sounds are normal. There is no distension or abdominal bruit.     Palpations: Abdomen is soft. There is no hepatomegaly or splenomegaly.     Tenderness: There is no abdominal tenderness. There is no right CVA tenderness or left CVA tenderness.     Hernia: No hernia is present.  Musculoskeletal:        General: Normal range of motion.     Cervical back: Normal range of motion and neck supple.     Right lower leg: No edema.     Left lower leg: No edema.  Lymphadenopathy:     Cervical: No cervical adenopathy.  Skin:    General: Skin is warm and dry.     Capillary Refill: Capillary refill takes less than 2 seconds.     Coloration: Skin is not cyanotic, jaundiced or pale.     Findings: No rash.       Neurological:     General: No focal deficit present.     Mental Status: She is alert and oriented to person, place, and time.     Cranial Nerves: Cranial nerves  are intact. No cranial nerve deficit.     Sensory: Sensation is intact. No sensory deficit.     Motor: Motor function is intact. No weakness.     Coordination: Coordination is intact. Coordination normal.     Gait: Gait is intact. Gait normal.     Deep Tendon Reflexes: Reflexes are normal and symmetric. Reflexes normal.  Psychiatric:        Attention and Perception: Attention and perception normal.        Mood and Affect: Mood and affect normal.        Speech: Speech normal.        Behavior: Behavior normal. Behavior is cooperative.        Thought Content: Thought content normal.        Cognition and Memory: Cognition and memory normal.        Judgment: Judgment normal.     Results for orders placed or performed in visit on 06/30/19  Microscopic Examination   URINE  Result Value Ref Range   WBC, UA 0-5 0 - 5 /hpf   RBC 0-2 0 - 2 /hpf   Epithelial Cells (non renal) 0-10 0 - 10 /hpf   Renal Epithel, UA None seen None seen /hpf   Mucus, UA Present Not Estab.   Bacteria, UA Few None seen/Few  urinalysis- dip and micro  Result Value Ref Range   Specific Gravity, UA 1.025 1.005 - 1.030   pH, UA 5.5 5.0 - 7.5   Color, UA Yellow Yellow   Appearance Ur Clear Clear   Leukocytes,UA Negative Negative   Protein,UA Negative Negative/Trace   Glucose, UA Negative Negative   Ketones, UA Negative Negative   RBC, UA Trace (A) Negative   Bilirubin, UA Negative Negative   Urobilinogen, Ur 0.2 0.2 - 1.0 mg/dL   Nitrite, UA Negative Negative   Microscopic Examination See below:        Pertinent labs & imaging results that were available during my care of the patient were reviewed by me and considered  in my medical decision making.  Assessment & Plan:  Mette was seen today for annual exam.  Diagnoses and all orders for this visit:  Routine general medical examination at a health care facility Health maintenance discussed in detail. Will request records from colonoscopy. Labs pending.   -     CBC with Differential/Platelet -     Lipid panel -     Thyroid Panel With TSH -     urinalysis- dip and micro  Vitamin D deficiency Labs pending. Continue repletion therapy. If indicated, will change repletion dosage. Eat foods rich in Vit D including milk, orange juice, yogurt with vitamin D added, salmon or mackerel, canned tuna fish, cereals with vitamin D added, and cod liver oil. Get out in the sun but make sure to wear at least SPF 30 sunscreen.  -     Vitamin D 25 hydroxy -     Vitamin D, Ergocalciferol, (DRISDOL) 1.25 MG (50000 UT) CAPS capsule; Take 1 capsule (50,000 Units total) by mouth every 7 (seven) days.  BMI 29.0-29.9,adult Diet and exercise encouraged. Labs pending.  -     CMP14+EGFR -     CBC with Differential/Platelet -     Lipid panel -     Thyroid Panel With TSH  Acute otitis media, left Symptomatic care discussed in detail. Medications as prescribed. Report any new, worsening, or persistent symptoms.  -     cefdinir (OMNICEF) 300 MG capsule; Take 1 capsule (300 mg total) by mouth 2 (two) times daily. 1 po BID  Bacteriuria Increase water intake. Avoid bladder irritants. Culture pending, will treat if warranted.  -     urinalysis- dip and micro -     Urine culture -     Microscopic Examination     Continue all other maintenance medications.  Follow up plan: Return if symptoms worsen or fail to improve.  Continue healthy lifestyle choices, including diet (rich in fruits, vegetables, and lean proteins, and low in salt and simple carbohydrates) and exercise (at least 30 minutes of moderate physical activity daily).  Educational handout given for health maintenance  The above assessment and management plan was discussed with the patient. The patient verbalized understanding of and has agreed to the management plan. Patient is aware to call the clinic if they develop any new symptoms or if symptoms persist or worsen. Patient is aware when to return to the  clinic for a follow-up visit. Patient educated on when it is appropriate to go to the emergency department.   Monia Pouch, FNP-C [facility] [phone]

## 2019-06-30 NOTE — Patient Instructions (Signed)
Health Maintenance, Female Adopting a healthy lifestyle and getting preventive care are important in promoting health and wellness. Ask your health care provider about:  The right schedule for you to have regular tests and exams.  Things you can do on your own to prevent diseases and keep yourself healthy. What should I know about diet, weight, and exercise? Eat a healthy diet   Eat a diet that includes plenty of vegetables, fruits, low-fat dairy products, and lean protein.  Do not eat a lot of foods that are high in solid fats, added sugars, or sodium. Maintain a healthy weight Body mass index (BMI) is used to identify weight problems. It estimates body fat based on height and weight. Your health care provider can help determine your BMI and help you achieve or maintain a healthy weight. Get regular exercise Get regular exercise. This is one of the most important things you can do for your health. Most adults should:  Exercise for at least 150 minutes each week. The exercise should increase your heart rate and make you sweat (moderate-intensity exercise).  Do strengthening exercises at least twice a week. This is in addition to the moderate-intensity exercise.  Spend less time sitting. Even light physical activity can be beneficial. Watch cholesterol and blood lipids Have your blood tested for lipids and cholesterol at 58 years of age, then have this test every 5 years. Have your cholesterol levels checked more often if:  Your lipid or cholesterol levels are high.  You are older than 58 years of age.  You are at high risk for heart disease. What should I know about cancer screening? Depending on your health history and family history, you may need to have cancer screening at various ages. This may include screening for:  Breast cancer.  Cervical cancer.  Colorectal cancer.  Skin cancer.  Lung cancer. What should I know about heart disease, diabetes, and high blood  pressure? Blood pressure and heart disease  High blood pressure causes heart disease and increases the risk of stroke. This is more likely to develop in people who have high blood pressure readings, are of African descent, or are overweight.  Have your blood pressure checked: ? Every 3-5 years if you are 18-39 years of age. ? Every year if you are 40 years old or older. Diabetes Have regular diabetes screenings. This checks your fasting blood sugar level. Have the screening done:  Once every three years after age 40 if you are at a normal weight and have a low risk for diabetes.  More often and at a younger age if you are overweight or have a high risk for diabetes. What should I know about preventing infection? Hepatitis B If you have a higher risk for hepatitis B, you should be screened for this virus. Talk with your health care provider to find out if you are at risk for hepatitis B infection. Hepatitis C Testing is recommended for:  Everyone born from 1945 through 1965.  Anyone with known risk factors for hepatitis C. Sexually transmitted infections (STIs)  Get screened for STIs, including gonorrhea and chlamydia, if: ? You are sexually active and are younger than 58 years of age. ? You are older than 58 years of age and your health care provider tells you that you are at risk for this type of infection. ? Your sexual activity has changed since you were last screened, and you are at increased risk for chlamydia or gonorrhea. Ask your health care provider if   you are at risk.  Ask your health care provider about whether you are at high risk for HIV. Your health care provider may recommend a prescription medicine to help prevent HIV infection. If you choose to take medicine to prevent HIV, you should first get tested for HIV. You should then be tested every 3 months for as long as you are taking the medicine. Pregnancy  If you are about to stop having your period (premenopausal) and  you may become pregnant, seek counseling before you get pregnant.  Take 400 to 800 micrograms (mcg) of folic acid every day if you become pregnant.  Ask for birth control (contraception) if you want to prevent pregnancy. Osteoporosis and menopause Osteoporosis is a disease in which the bones lose minerals and strength with aging. This can result in bone fractures. If you are 65 years old or older, or if you are at risk for osteoporosis and fractures, ask your health care provider if you should:  Be screened for bone loss.  Take a calcium or vitamin D supplement to lower your risk of fractures.  Be given hormone replacement therapy (HRT) to treat symptoms of menopause. Follow these instructions at home: Lifestyle  Do not use any products that contain nicotine or tobacco, such as cigarettes, e-cigarettes, and chewing tobacco. If you need help quitting, ask your health care provider.  Do not use street drugs.  Do not share needles.  Ask your health care provider for help if you need support or information about quitting drugs. Alcohol use  Do not drink alcohol if: ? Your health care provider tells you not to drink. ? You are pregnant, may be pregnant, or are planning to become pregnant.  If you drink alcohol: ? Limit how much you use to 0-1 drink a day. ? Limit intake if you are breastfeeding.  Be aware of how much alcohol is in your drink. In the [place] one drink equals one 12 oz bottle of beer (355 mL), one 5 oz glass of wine (148 mL), or one 1 oz glass of hard liquor (44 mL). General instructions  Schedule regular health, dental, and eye exams.  Stay current with your vaccines.  Tell your health care provider if: ? You often feel depressed. ? You have ever been abused or do not feel safe at home. Summary  Adopting a healthy lifestyle and getting preventive care are important in promoting health and wellness.  Follow your health care provider's instructions about healthy  diet, exercising, and getting tested or screened for diseases.  Follow your health care provider's instructions on monitoring your cholesterol and blood pressure. This information is not intended to replace advice given to you by your health care provider. Make sure you discuss any questions you have with your health care provider. Document Released: 01/07/2011 Document Revised: 06/17/2018 Document Reviewed: 06/17/2018 Elsevier Patient Education  2020 Elsevier Inc.  

## 2019-07-01 LAB — CMP14+EGFR
ALT: 20 IU/L (ref 0–32)
AST: 27 IU/L (ref 0–40)
Albumin/Globulin Ratio: 1.6 (ref 1.2–2.2)
Albumin: 4.7 g/dL (ref 3.8–4.9)
Alkaline Phosphatase: 120 IU/L — ABNORMAL HIGH (ref 39–117)
BUN/Creatinine Ratio: 9 (ref 9–23)
BUN: 7 mg/dL (ref 6–24)
Bilirubin Total: 0.4 mg/dL (ref 0.0–1.2)
CO2: 24 mmol/L (ref 20–29)
Calcium: 10 mg/dL (ref 8.7–10.2)
Chloride: 105 mmol/L (ref 96–106)
Creatinine, Ser: 0.8 mg/dL (ref 0.57–1.00)
GFR calc Af Amer: 94 mL/min/{1.73_m2} (ref >59)
GFR calc non Af Amer: 82 mL/min/{1.73_m2} (ref >59)
Globulin, Total: 2.9 g/dL (ref 1.5–4.5)
Glucose: 92 mg/dL (ref 65–99)
Potassium: 3.9 mmol/L (ref 3.5–5.2)
Sodium: 145 mmol/L — ABNORMAL HIGH (ref 134–144)
Total Protein: 7.6 g/dL (ref 6.0–8.5)

## 2019-07-01 LAB — CBC WITH DIFFERENTIAL/PLATELET
Basophils Absolute: 0 10*3/uL (ref 0.0–0.2)
Basos: 1 %
EOS (ABSOLUTE): 0.1 10*3/uL (ref 0.0–0.4)
Eos: 1 %
Hematocrit: 44.4 % (ref 34.0–46.6)
Hemoglobin: 14.1 g/dL (ref 11.1–15.9)
Immature Grans (Abs): 0 10*3/uL (ref 0.0–0.1)
Immature Granulocytes: 0 %
Lymphocytes Absolute: 2.7 10*3/uL (ref 0.7–3.1)
Lymphs: 46 %
MCH: 26.1 pg — ABNORMAL LOW (ref 26.6–33.0)
MCHC: 31.8 g/dL (ref 31.5–35.7)
MCV: 82 fL (ref 79–97)
Monocytes Absolute: 0.5 10*3/uL (ref 0.1–0.9)
Monocytes: 8 %
Neutrophils Absolute: 2.5 10*3/uL (ref 1.4–7.0)
Neutrophils: 44 %
Platelets: 213 10*3/uL (ref 150–450)
RBC: 5.4 x10E6/uL — ABNORMAL HIGH (ref 3.77–5.28)
RDW: 14.7 % (ref 11.7–15.4)
WBC: 5.8 10*3/uL (ref 3.4–10.8)

## 2019-07-01 LAB — VITAMIN D 25 HYDROXY (VIT D DEFICIENCY, FRACTURES): Vit D, 25-Hydroxy: 64.9 ng/mL (ref 30.0–100.0)

## 2019-07-01 LAB — LIPID PANEL
Chol/HDL Ratio: 2.1 ratio (ref 0.0–4.4)
Cholesterol, Total: 191 mg/dL (ref 100–199)
HDL: 92 mg/dL (ref >39)
LDL Chol Calc (NIH): 85 mg/dL (ref 0–99)
Triglycerides: 78 mg/dL (ref 0–149)
VLDL Cholesterol Cal: 14 mg/dL (ref 5–40)

## 2019-07-01 LAB — THYROID PANEL WITH TSH
Free Thyroxine Index: 1.7 (ref 1.2–4.9)
T3 Uptake Ratio: 23 % — ABNORMAL LOW (ref 24–39)
T4, Total: 7.6 ug/dL (ref 4.5–12.0)
TSH: 2.61 u[IU]/mL (ref 0.450–4.500)

## 2019-07-02 LAB — URINE CULTURE: Organism ID, Bacteria: NO GROWTH

## 2019-07-04 NOTE — Progress Notes (Signed)
Urine culture negative, no need for therapy.  Sodium was slightly elevated at 145, limit intake of sodium in diet and drink plenty of water. Alk Phos was elevated at 120. This needs to be rechecked along with isoenzymes and a GGT. Make sure fasting when having this done.  Glucose and kidney functions are normal. CBC is normal, slight variations are normal.  Cholesterol is normal.  TSH is normal. T3 is one point low but TSH and T4 are normal. No indication for repletion therapy.  Vit D is normal.

## 2019-07-06 ENCOUNTER — Other Ambulatory Visit: Payer: Self-pay

## 2019-07-06 ENCOUNTER — Other Ambulatory Visit: Payer: BC Managed Care – PPO

## 2019-07-06 ENCOUNTER — Other Ambulatory Visit: Payer: Self-pay | Admitting: Nurse Practitioner

## 2019-07-06 DIAGNOSIS — R748 Abnormal levels of other serum enzymes: Secondary | ICD-10-CM

## 2019-07-07 LAB — HEPATIC FUNCTION PANEL
ALT: 27 IU/L (ref 0–32)
AST: 28 IU/L (ref 0–40)
Albumin: 4.4 g/dL (ref 3.8–4.9)
Alkaline Phosphatase: 117 IU/L (ref 39–117)
Bilirubin Total: 0.4 mg/dL (ref 0.0–1.2)
Bilirubin, Direct: 0.12 mg/dL (ref 0.00–0.40)
Total Protein: 7.2 g/dL (ref 6.0–8.5)

## 2019-07-07 LAB — GAMMA GT: GGT: 23 IU/L (ref 0–60)

## 2019-09-16 ENCOUNTER — Other Ambulatory Visit: Payer: Self-pay | Admitting: Family Medicine

## 2019-09-16 DIAGNOSIS — E559 Vitamin D deficiency, unspecified: Secondary | ICD-10-CM

## 2019-10-23 ENCOUNTER — Ambulatory Visit: Payer: Self-pay | Attending: Internal Medicine

## 2019-10-23 DIAGNOSIS — Z23 Encounter for immunization: Secondary | ICD-10-CM

## 2019-10-23 NOTE — Progress Notes (Signed)
   Covid-19 Vaccination Clinic  Name:  Heather Mcclain    MRN: [id number] DOB: 1961-04-25  10/23/2019  [name] was observed post Covid-19 immunization for 30 minutes based on pre-vaccination screening without incident. She was provided with Vaccine Information Sheet and instruction to access the V-Safe system.   [name] was instructed to call 911 with any severe reactions post vaccine: Marland Kitchen Difficulty breathing  . Swelling of face and throat  . A fast heartbeat  . A bad rash all over body  . Dizziness and weakness   Immunizations Administered    Name Date Dose VIS Date Route   Pfizer COVID-19 Vaccine 10/23/2019 12:03 PM 0.3 mL 06/18/2019 Intramuscular   Manufacturer: [employer], [employer]   Lot: [id number]   NDC: [address]-1

## 2019-11-13 ENCOUNTER — Ambulatory Visit: Payer: Self-pay | Attending: Internal Medicine

## 2019-11-13 DIAGNOSIS — Z23 Encounter for immunization: Secondary | ICD-10-CM

## 2019-11-13 NOTE — Progress Notes (Signed)
   Covid-19 Vaccination Clinic  Name:  Heather Mcclain    MRN: [id number] DOB: 04-09-1961  11/13/2019  Heather Mcclain was observed post Covid-19 immunization for 30 minutes based on pre-vaccination screening without incident. She was provided with Vaccine Information Sheet and instruction to access the V-Safe system.   Heather Mcclain was instructed to call 911 with any severe reactions post vaccine: Marland Kitchen Difficulty breathing  . Swelling of face and throat  . A fast heartbeat  . A bad rash all over body  . Dizziness and weakness   Immunizations Administered    Name Date Dose VIS Date Route   Pfizer COVID-19 Vaccine 11/13/2019 12:02 PM 0.3 mL 09/01/2018 Intramuscular   Manufacturer: [employer], [employer]   Lot: [id number]   NDC: [address]-5

## 2019-12-21 ENCOUNTER — Encounter: Payer: Self-pay | Admitting: Family Medicine

## 2019-12-21 ENCOUNTER — Ambulatory Visit (INDEPENDENT_AMBULATORY_CARE_PROVIDER_SITE_OTHER): Payer: BC Managed Care – PPO | Admitting: Family Medicine

## 2019-12-21 DIAGNOSIS — J302 Other seasonal allergic rhinitis: Secondary | ICD-10-CM

## 2019-12-21 MED ORDER — FLUTICASONE PROPIONATE 50 MCG/ACT NA SUSP: 2.0000 | Freq: Every day | NASAL | 2 refills | Status: DC

## 2019-12-21 NOTE — Progress Notes (Signed)
° °  Virtual Visit via Telephone Note  I connected with Heather Mcclain on 12/21/19 at 1:45 PM by telephone and verified that I am speaking with the correct person using two identifiers. Heather Mcclain is currently located at home and nobody is currently with her during this visit. The provider, Gwenlyn Fudge, FNP is located in their home at time of visit.  I discussed the limitations, risks, security and privacy concerns of performing an evaluation and management service by telephone and the availability of in person appointments. I also discussed with the patient that there may be a patient responsible charge related to this service. The patient expressed understanding and agreed to proceed.  Subjective: PCP: Bennie Pierini, FNP  Chief Complaint  Patient presents with   Allergies   Patient complains of a dry cough, runny nose, sneezing, sore throat, burning eyes and hoarseness. Onset of symptoms was 2 days ago, unchanged since that time. She is drinking plenty of fluids. Evaluation to date: none. Treatment to date: antihistamines and Tylenol Cold & Flu. She has a history of allergies. She does not smoke.    ROS: Per HPI  Current Outpatient Medications:    loratadine (CLARITIN) 10 MG tablet, Take 10 mg by mouth daily., Disp: , Rfl:    Vitamin D, Ergocalciferol, (DRISDOL) 1.25 MG (50000 UT) CAPS capsule, Take 1 capsule (50,000 Units total) by mouth every 7 (seven) days., Disp: 12 capsule, Rfl: 0  Allergies  Allergen Reactions   Penicillins Rash   History reviewed. No pertinent past medical history.  Observations/Objective: A&O  No respiratory distress or wheezing audible over the phone Mood, judgement, and thought processes all WNL   Assessment and Plan: 1. Seasonal allergies - Encouraged to use Flonase in the AM and Claritin in the PM. Discussed possibility of this being the beginning of a viral illness and discussed symptom management. Also discussed  COVID-19 testing which she does not plan to do.  - fluticasone (FLONASE) 50 MCG/ACT nasal spray; Place 2 sprays into both nostrils daily.  Dispense: 16 g; Refill: 2   Follow Up Instructions:  I discussed the assessment and treatment plan with the patient. The patient was provided an opportunity to ask questions and all were answered. The patient agreed with the plan and demonstrated an understanding of the instructions.   The patient was advised to call back or seek an in-person evaluation if the symptoms worsen or if the condition fails to improve as anticipated.  The above assessment and management plan was discussed with the patient. The patient verbalized understanding of and has agreed to the management plan. Patient is aware to call the clinic if symptoms persist or worsen. Patient is aware when to return to the clinic for a follow-up visit. Patient educated on when it is appropriate to go to the emergency department.   Time call ended: 1:56 PM  I provided 13 minutes of non-face-to-face time during this encounter.  Deliah Boston, MSN, APRN, FNP-C [facility] 12/21/19

## 2021-05-04 ENCOUNTER — Other Ambulatory Visit: Payer: Self-pay

## 2021-05-04 ENCOUNTER — Ambulatory Visit (INDEPENDENT_AMBULATORY_CARE_PROVIDER_SITE_OTHER): Payer: BC Managed Care – PPO | Admitting: Nurse Practitioner

## 2021-05-04 ENCOUNTER — Ambulatory Visit (INDEPENDENT_AMBULATORY_CARE_PROVIDER_SITE_OTHER): Payer: BC Managed Care – PPO

## 2021-05-04 ENCOUNTER — Encounter: Payer: Self-pay | Admitting: Nurse Practitioner

## 2021-05-04 VITALS — BP 142/80 | HR 69 | Temp 97.9°F | Resp 20 | Ht 66.0 in | Wt 194.0 lb

## 2021-05-04 DIAGNOSIS — E559 Vitamin D deficiency, unspecified: Secondary | ICD-10-CM

## 2021-05-04 DIAGNOSIS — Z6831 Body mass index (BMI) 31.0-31.9, adult: Secondary | ICD-10-CM | POA: Diagnosis not present

## 2021-05-04 DIAGNOSIS — Z0001 Encounter for general adult medical examination with abnormal findings: Secondary | ICD-10-CM | POA: Diagnosis not present

## 2021-05-04 DIAGNOSIS — Z0000 Encounter for general adult medical examination without abnormal findings: Secondary | ICD-10-CM | POA: Diagnosis not present

## 2021-05-04 DIAGNOSIS — Z23 Encounter for immunization: Secondary | ICD-10-CM

## 2021-05-04 LAB — MICROSCOPIC EXAMINATION
Bacteria, UA: NONE SEEN
RBC, Urine: NONE SEEN /hpf (ref 0–2)
WBC, UA: NONE SEEN /hpf (ref 0–5)

## 2021-05-04 LAB — URINALYSIS, COMPLETE
Bilirubin, UA: NEGATIVE
Glucose, UA: NEGATIVE
Ketones, UA: NEGATIVE
Leukocytes,UA: NEGATIVE
Nitrite, UA: NEGATIVE
Protein,UA: NEGATIVE
Specific Gravity, UA: 1.01 (ref 1.005–1.030)
Urobilinogen, Ur: 0.2 mg/dL (ref 0.2–1.0)
pH, UA: 6 (ref 5.0–7.5)

## 2021-05-04 NOTE — Addendum Note (Signed)
Addended by: Cleda Daub on: 05/04/2021 04:56 PM   Modules accepted: Orders

## 2021-05-04 NOTE — Patient Instructions (Signed)
Exercising to Stay Healthy °To become healthy and stay healthy, it is recommended that you do moderate-intensity and vigorous-intensity exercise. You can tell that you are exercising at a moderate intensity if your heart starts beating faster and you start breathing faster but can still hold a conversation. You can tell that you are exercising at a vigorous intensity if you are breathing much harder and faster and cannot hold a conversation while exercising. °How can exercise benefit me? °Exercising regularly is important. It has many health benefits, such as: °Improving overall fitness, flexibility, and endurance. °Increasing bone density. °Helping with weight control. °Decreasing body fat. °Increasing muscle strength and endurance. °Reducing stress and tension, anxiety, depression, or anger. °Improving overall health. °What guidelines should I follow while exercising? °Before you start a new exercise program, talk with your health care provider. °Do not exercise so much that you hurt yourself, feel dizzy, or get very short of breath. °Wear comfortable clothes and wear shoes with good support. °Drink plenty of water while you exercise to prevent dehydration or heat stroke. °Work out until your breathing and your heartbeat get faster (moderate intensity). °How often should I exercise? °Choose an activity that you enjoy, and set realistic goals. Your health care provider can help you make an activity plan that is individually designed and works best for you. °Exercise regularly as told by your health care provider. This may include: °Doing strength training two times a week, such as: °Lifting weights. °Using resistance bands. °Push-ups. °Sit-ups. °Yoga. °Doing a certain intensity of exercise for a given amount of time. Choose from these options: °A total of 150 minutes of moderate-intensity exercise every week. °A total of 75 minutes of vigorous-intensity exercise every week. °A mix of moderate-intensity and  vigorous-intensity exercise every week. °Children, pregnant women, people who have not exercised regularly, people who are overweight, and older adults may need to talk with a health care provider about what activities are safe to perform. If you have a medical condition, be sure to talk with your health care provider before you start a new exercise program. °What are some exercise ideas? °Moderate-intensity exercise ideas include: °Walking 1 mile (1.6 km) in about 15 minutes. °Biking. °Hiking. °Golfing. °Dancing. °Water aerobics. °Vigorous-intensity exercise ideas include: °Walking 4.5 miles (7.2 km) or more in about 1 hour. °Jogging or running 5 miles (8 km) in about 1 hour. °Biking 10 miles (16.1 km) or more in about 1 hour. °Lap swimming. °Roller-skating or in-line skating. °Cross-country skiing. °Vigorous competitive sports, such as football, basketball, and soccer. °Jumping rope. °Aerobic dancing. °What are some everyday activities that can help me get exercise? °Yard work, such as: °Pushing a lawn mower. °Raking and bagging leaves. °Washing your car. °Pushing a stroller. °Shoveling snow. °Gardening. °Washing windows or floors. °How can I be more active in my day-to-day activities? °Use stairs instead of an elevator. °Take a walk during your lunch break. °If you drive, park your car farther away from your work or school. °If you take public transportation, get off one stop early and walk the rest of the way. °Stand up or walk around during all of your indoor phone calls. °Get up, stretch, and walk around every 30 minutes throughout the day. °Enjoy exercise with a friend. Support to continue exercising will help you keep a regular routine of activity. °Where to find more information °You can find more information about exercising to stay healthy from: °U.S. Department of Health and Human Services: www.hhs.gov °Centers for Disease Control and Prevention (  CDC): www.cdc.gov °Summary °Exercising regularly is  important. It will improve your overall fitness, flexibility, and endurance. °Regular exercise will also improve your overall health. It can help you control your weight, reduce stress, and improve your bone density. °Do not exercise so much that you hurt yourself, feel dizzy, or get very short of breath. °Before you start a new exercise program, talk with your health care provider. °This information is not intended to replace advice given to you by your health care provider. Make sure you discuss any questions you have with your health care provider. °Document Revised: 10/20/2020 Document Reviewed: 10/20/2020 °Elsevier Patient Education © 2022 Elsevier Inc. ° °

## 2021-05-04 NOTE — Progress Notes (Signed)
Subjective:    Patient ID: Heather Mcclain, female    DOB: 01-25-1961, 60 y.o.   MRN: [id number]   Chief Complaint: annual physical   HPI:  1. Annual physical exam No PAP  2. Vitamin D deficiency Is on weekly  vitamin d supplement  3. BMI 29.0-29.9,adult Weight is up 9lbs Wt Readings from Last 3 Encounters:  05/04/21 194 lb (88 kg)  06/30/19 185 lb (83.9 kg)  01/16/17 194 lb (88 kg)   BMI Readings from Last 3 Encounters:  05/04/21 31.31 kg/m  06/30/19 29.86 kg/m  01/16/17 31.31 kg/m    Depression screen [facility] 2/9 05/04/2021 06/30/2019 01/16/2017  Decreased Interest 0 0 0  Down, Depressed, Hopeless 0 0 0  PHQ - 2 Score 0 0 0  Altered sleeping 0 - -  Tired, decreased energy 0 - -  Change in appetite 0 - -  Feeling bad or failure about yourself  0 - -  Trouble concentrating 0 - -  Moving slowly or fidgety/restless 0 - -  Suicidal thoughts 0 - -  PHQ-9 Score 0 - -  Difficult doing work/chores Not difficult at all - -   GAD 7 : Generalized Anxiety Score 05/04/2021  Nervous, Anxious, on Edge 0  Control/stop worrying 0  Worry too much - different things 0  Trouble relaxing 0  Restless 0  Easily annoyed or irritable 0  Afraid - awful might happen 0  Total GAD 7 Score 0  Anxiety Difficulty Not difficult at all       Outpatient Encounter Medications as of 05/04/2021  Medication Sig   fluticasone (FLONASE) 50 MCG/ACT nasal spray Place 2 sprays into both nostrils daily.   loratadine (CLARITIN) 10 MG tablet Take 10 mg by mouth daily.   Vitamin D, Ergocalciferol, (DRISDOL) 1.25 MG (50000 UT) CAPS capsule Take 1 capsule (50,000 Units total) by mouth every 7 (seven) days.   No facility-administered encounter medications on file as of 05/04/2021.    No past surgical history on file.  No family history on file.  New complaints: Hands feel like they ar going numb at times. Mainly when she is gripping something.  Social history: Loves with husband and  handicap daughter.  Controlled substance contract: n/a     Review of Systems  Constitutional:  Negative for diaphoresis.  Eyes:  Negative for pain.  Respiratory:  Negative for shortness of breath.   Cardiovascular:  Negative for chest pain, palpitations and leg swelling.  Gastrointestinal:  Negative for abdominal pain.  Endocrine: Negative for polydipsia.  Skin:  Negative for rash.  Neurological:  Negative for dizziness, weakness and headaches.  Hematological:  Does not bruise/bleed easily.  All other systems reviewed and are negative.     Objective:   Physical Exam Vitals and nursing note reviewed.  Constitutional:      General: She is not in acute distress.    Appearance: Normal appearance. She is well-developed.  HENT:     Head: Normocephalic.     Right Ear: Tympanic membrane normal.     Left Ear: Tympanic membrane normal.     Nose: Nose normal.     Mouth/Throat:     Mouth: Mucous membranes are moist.  Eyes:     Pupils: Pupils are equal, round, and reactive to light.  Neck:     Vascular: No carotid bruit or JVD.  Cardiovascular:     Rate and Rhythm: Normal rate and regular rhythm.     Heart sounds: Normal heart sounds.  Pulmonary:     Effort: Pulmonary effort is normal. No respiratory distress.     Breath sounds: Normal breath sounds. No wheezing or rales.  Chest:     Chest wall: No tenderness.  Abdominal:     General: Bowel sounds are normal. There is no distension or abdominal bruit.     Palpations: Abdomen is soft. There is no hepatomegaly, splenomegaly, mass or pulsatile mass.     Tenderness: There is no abdominal tenderness.  Musculoskeletal:        General: Normal range of motion.     Cervical back: Normal range of motion and neck supple.     Comments: Cyst on distal PIP joint of right thumb  Lymphadenopathy:     Cervical: No cervical adenopathy.  Skin:    General: Skin is warm and dry.  Neurological:     Mental Status: She is alert and oriented to  person, place, and time.     Deep Tendon Reflexes: Reflexes are normal and symmetric.  Psychiatric:        Behavior: Behavior normal.        Thought Content: Thought content normal.        Judgment: Judgment normal.    BP (!) 142/80   Pulse 69   Temp 97.9 F (36.6 C) (Temporal)   Resp 20   Ht 5' 6"  (1.676 m)   Wt 194 lb (88 kg)   BMI 31.31 kg/m         Assessment & Plan:   Stpehanie Mcclain comes in today with chief complaint of Annual Exam (No pap/)   Diagnosis and orders addressed:  1. Annual physical exam - Urinalysis, Complete - Arthritis Panel - CBC with Differential/Platelet - CMP14+EGFR - Lipid panel - Thyroid Panel With TSH  2. Vitamin D deficiency - VITAMIN D 25 Hydroxy (Vit-D Deficiency, Fractures)  3. BMI 31.0-31.9,adult Discussed diet and exercise for person with BMI >25 Will recheck weight in 3-6 months  Keep diary of blood pressure at home and report if top number is above 140 consistently   Labs pending Health Maintenance reviewed Diet and exercise encouraged  Follow up plan: 6 months   [place]ker, FNP

## 2021-05-05 LAB — CMP14+EGFR
ALT: 16 IU/L (ref 0–32)
AST: 22 IU/L (ref 0–40)
Albumin/Globulin Ratio: 1.5 (ref 1.2–2.2)
Albumin: 4.6 g/dL (ref 3.8–4.9)
Alkaline Phosphatase: 121 IU/L (ref 44–121)
BUN/Creatinine Ratio: 9 — ABNORMAL LOW (ref 12–28)
BUN: 8 mg/dL (ref 8–27)
Bilirubin Total: 0.4 mg/dL (ref 0.0–1.2)
CO2: 24 mmol/L (ref 20–29)
Calcium: 9.9 mg/dL (ref 8.7–10.3)
Chloride: 103 mmol/L (ref 96–106)
Creatinine, Ser: 0.89 mg/dL (ref 0.57–1.00)
Globulin, Total: 3 g/dL (ref 1.5–4.5)
Glucose: 95 mg/dL (ref 70–99)
Potassium: 4.1 mmol/L (ref 3.5–5.2)
Sodium: 144 mmol/L (ref 134–144)
Total Protein: 7.6 g/dL (ref 6.0–8.5)
eGFR: 74 mL/min/{1.73_m2} (ref >59)

## 2021-05-05 LAB — ARTHRITIS PANEL
Basophils Absolute: 0 10*3/uL (ref 0.0–0.2)
Basos: 1 %
EOS (ABSOLUTE): 0.1 10*3/uL (ref 0.0–0.4)
Eos: 2 %
Hematocrit: 43.3 % (ref 34.0–46.6)
Hemoglobin: 13.9 g/dL (ref 11.1–15.9)
Immature Grans (Abs): 0 10*3/uL (ref 0.0–0.1)
Immature Granulocytes: 0 %
Lymphocytes Absolute: 2.5 10*3/uL (ref 0.7–3.1)
Lymphs: 47 %
MCH: 26.2 pg — ABNORMAL LOW (ref 26.6–33.0)
MCHC: 32.1 g/dL (ref 31.5–35.7)
MCV: 82 fL (ref 79–97)
Monocytes Absolute: 0.5 10*3/uL (ref 0.1–0.9)
Monocytes: 9 %
Neutrophils Absolute: 2.2 10*3/uL (ref 1.4–7.0)
Neutrophils: 41 %
Platelets: 221 10*3/uL (ref 150–450)
RBC: 5.3 x10E6/uL — ABNORMAL HIGH (ref 3.77–5.28)
RDW: 15 % (ref 11.7–15.4)
Rheumatoid fact SerPl-aCnc: <10 IU/mL (ref <14.0)
Sed Rate: 37 mm/hr (ref 0–40)
Uric Acid: 5.2 mg/dL (ref 3.0–7.2)
WBC: 5.3 10*3/uL (ref 3.4–10.8)

## 2021-05-05 LAB — VITAMIN D 25 HYDROXY (VIT D DEFICIENCY, FRACTURES): Vit D, 25-Hydroxy: 53.6 ng/mL (ref 30.0–100.0)

## 2021-05-05 LAB — LIPID PANEL
Chol/HDL Ratio: 2 ratio (ref 0.0–4.4)
Cholesterol, Total: 180 mg/dL (ref 100–199)
HDL: 90 mg/dL (ref >39)
LDL Chol Calc (NIH): 76 mg/dL (ref 0–99)
Triglycerides: 79 mg/dL (ref 0–149)
VLDL Cholesterol Cal: 14 mg/dL (ref 5–40)

## 2021-05-05 LAB — THYROID PANEL WITH TSH
Free Thyroxine Index: 2.1 (ref 1.2–4.9)
T3 Uptake Ratio: 25 % (ref 24–39)
T4, Total: 8.5 ug/dL (ref 4.5–12.0)
TSH: 2.86 u[IU]/mL (ref 0.450–4.500)

## 2022-04-10 ENCOUNTER — Other Ambulatory Visit (HOSPITAL_BASED_OUTPATIENT_CLINIC_OR_DEPARTMENT_OTHER): Payer: Self-pay

## 2022-04-10 MED ORDER — WEGOVY 0.25 MG/0.5ML ~~LOC~~ SOAJ
SUBCUTANEOUS | 0 refills | Status: DC
  Filled 2022-04-10: qty 2, 28d supply, fill #0

## 2022-04-11 ENCOUNTER — Other Ambulatory Visit (HOSPITAL_BASED_OUTPATIENT_CLINIC_OR_DEPARTMENT_OTHER): Payer: Self-pay

## 2022-04-12 ENCOUNTER — Other Ambulatory Visit (HOSPITAL_BASED_OUTPATIENT_CLINIC_OR_DEPARTMENT_OTHER): Payer: Self-pay

## 2022-04-25 ENCOUNTER — Other Ambulatory Visit (HOSPITAL_BASED_OUTPATIENT_CLINIC_OR_DEPARTMENT_OTHER): Payer: Self-pay

## 2022-05-07 ENCOUNTER — Other Ambulatory Visit (HOSPITAL_BASED_OUTPATIENT_CLINIC_OR_DEPARTMENT_OTHER): Payer: Self-pay

## 2022-05-10 ENCOUNTER — Ambulatory Visit (INDEPENDENT_AMBULATORY_CARE_PROVIDER_SITE_OTHER): Payer: BC Managed Care – PPO | Admitting: Nurse Practitioner

## 2022-05-10 ENCOUNTER — Encounter: Payer: Self-pay | Admitting: Nurse Practitioner

## 2022-05-10 VITALS — BP 139/89 | HR 66 | Temp 98.2°F | Resp 20 | Ht 66.0 in | Wt 192.0 lb

## 2022-05-10 DIAGNOSIS — Z0001 Encounter for general adult medical examination with abnormal findings: Secondary | ICD-10-CM | POA: Diagnosis not present

## 2022-05-10 DIAGNOSIS — Z6830 Body mass index (BMI) 30.0-30.9, adult: Secondary | ICD-10-CM | POA: Diagnosis not present

## 2022-05-10 DIAGNOSIS — Z0000 Encounter for general adult medical examination without abnormal findings: Secondary | ICD-10-CM

## 2022-05-10 DIAGNOSIS — E559 Vitamin D deficiency, unspecified: Secondary | ICD-10-CM | POA: Diagnosis not present

## 2022-05-10 LAB — URINALYSIS, COMPLETE
Bilirubin, UA: NEGATIVE
Glucose, UA: NEGATIVE
Ketones, UA: NEGATIVE
Nitrite, UA: NEGATIVE
Specific Gravity, UA: >1.03 — ABNORMAL HIGH (ref 1.005–1.030)
Urobilinogen, Ur: 0.2 mg/dL (ref 0.2–1.0)
pH, UA: 5.5 (ref 5.0–7.5)

## 2022-05-10 LAB — MICROSCOPIC EXAMINATION: Renal Epithel, UA: NONE SEEN /hpf

## 2022-05-10 NOTE — Progress Notes (Signed)
 Subjective:    Patient ID: Heather Mcclain, female    DOB: 05/19/1961, 61 y.o.   MRN: [id number]   Chief Complaint: Annual Exam (No pap)    HPI:  Heather Mcclain is a 61 y.o. who identifies as a female who was assigned female at birth.   Social history: Lives with: husband and daughter   Comes in today for follow up of the following chronic medical issues:  1. Annual physical exam No pap  2. Vitamin D deficiency Is on weekly vitamin d supplement  3. BMI 30.0-30.9,adult No recent weight changes- she is on wegovy Wt Readings from Last 3 Encounters:  05/10/22 192 lb (87.1 kg)  05/04/21 194 lb (88 kg)  06/30/19 185 lb (83.9 kg)   BMI Readings from Last 3 Encounters:  05/10/22 30.99 kg/m  05/04/21 31.31 kg/m  06/30/19 29.86 kg/m      New complaints: None today  Allergies  Allergen Reactions   Penicillins Rash   Outpatient Encounter Medications as of 05/10/2022  Medication Sig   cromolyn (OPTICROM) 4 % ophthalmic solution 1 drop 2 (two) times daily.   ergocalciferol (VITAMIN D2) 1.25 MG (50000 UT) capsule Take 50,000 Units by mouth once a week.   Loratadine (CLARITIN PO) Take by mouth.   prednisoLONE acetate (PRED FORTE) 1 % ophthalmic suspension SMARTSIG:In Eye(s)   Semaglutide-Weight Management (WEGOVY) 0.25 MG/0.5ML SOAJ Inject 0.25 mg under the skin once weekly. (Patient not taking: Reported on 05/10/2022)   No facility-administered encounter medications on file as of 05/10/2022.    Past Surgical History:  Procedure Laterality Date   GALLBLADDER SURGERY     TUBAL LIGATION      Family History  Problem Relation Age of Onset   Hypertension Father    Diabetes Father       Controlled substance contract: n/a     Review of Systems  Constitutional:  Negative for diaphoresis.  Eyes:  Negative for pain.  Respiratory:  Negative for shortness of breath.   Cardiovascular:  Negative for chest pain, palpitations and leg swelling.   Gastrointestinal:  Negative for abdominal pain.  Endocrine: Negative for polydipsia.  Skin:  Negative for rash.  Neurological:  Negative for dizziness, weakness and headaches.  Hematological:  Does not bruise/bleed easily.  All other systems reviewed and are negative.      Objective:   Physical Exam Vitals and nursing note reviewed.  Constitutional:      General: She is not in acute distress.    Appearance: Normal appearance. She is well-developed.  HENT:     Head: Normocephalic.     Right Ear: Tympanic membrane normal.     Left Ear: Tympanic membrane normal.     Nose: Nose normal.     Mouth/Throat:     Mouth: Mucous membranes are moist.  Eyes:     Pupils: Pupils are equal, round, and reactive to light.  Neck:     Vascular: No carotid bruit or JVD.  Cardiovascular:     Rate and Rhythm: Normal rate and regular rhythm.     Heart sounds: Normal heart sounds.  Pulmonary:     Effort: Pulmonary effort is normal. No respiratory distress.     Breath sounds: Normal breath sounds. No wheezing or rales.  Chest:     Chest wall: No tenderness.  Abdominal:     General: Bowel sounds are normal. There is no distension or abdominal bruit.     Palpations: Abdomen is soft. There is no hepatomegaly, splenomegaly, mass   or pulsatile mass.     Tenderness: There is no abdominal tenderness.  Musculoskeletal:        General: Normal range of motion.     Cervical back: Normal range of motion and neck supple.  Lymphadenopathy:     Cervical: No cervical adenopathy.  Skin:    General: Skin is warm and dry.  Neurological:     Mental Status: She is alert and oriented to person, place, and time.     Deep Tendon Reflexes: Reflexes are normal and symmetric.  Psychiatric:        Behavior: Behavior normal.        Thought Content: Thought content normal.        Judgment: Judgment normal.    BP 139/89   Pulse 66   Temp 98.2 F (36.8 C) (Temporal)   Resp 20   Ht 5' 6" (1.676 m)   Wt 192 lb (87.1  kg)   SpO2 98%   BMI 30.99 kg/m         Assessment & Plan:   Heather Mcclain comes in today with chief complaint of Annual Exam (No pap)   Diagnosis and orders addressed:  1. Annual physical exam Labs pending - CBC with Differential/Platelet - CMP14+EGFR - Lipid panel - Thyroid Panel With TSH - Urinalysis, Complete  2. Vitamin D deficiency Continue vitamin d supplement - VITAMIN D 25 Hydroxy (Vit-D Deficiency, Fractures)  3. BMI 30.0-30.9,adult Discussed diet and exercise for person with BMI >25 Will recheck weight in 3-6 months    Labs pending Health Maintenance reviewed Diet and exercise encouraged  Follow up plan: 1 year    [place], FNP

## 2022-05-11 LAB — CMP14+EGFR
ALT: 17 IU/L (ref 0–32)
AST: 20 IU/L (ref 0–40)
Albumin/Globulin Ratio: 1.7 (ref 1.2–2.2)
Albumin: 4.5 g/dL (ref 3.9–4.9)
Alkaline Phosphatase: 112 IU/L (ref 44–121)
BUN/Creatinine Ratio: 8 — ABNORMAL LOW (ref 12–28)
BUN: 7 mg/dL — ABNORMAL LOW (ref 8–27)
Bilirubin Total: 0.3 mg/dL (ref 0.0–1.2)
CO2: 25 mmol/L (ref 20–29)
Calcium: 9.6 mg/dL (ref 8.7–10.3)
Chloride: 104 mmol/L (ref 96–106)
Creatinine, Ser: 0.84 mg/dL (ref 0.57–1.00)
Globulin, Total: 2.7 g/dL (ref 1.5–4.5)
Glucose: 93 mg/dL (ref 70–99)
Potassium: 4.3 mmol/L (ref 3.5–5.2)
Sodium: 142 mmol/L (ref 134–144)
Total Protein: 7.2 g/dL (ref 6.0–8.5)
eGFR: 79 mL/min/{1.73_m2} (ref >59)

## 2022-05-11 LAB — CBC WITH DIFFERENTIAL/PLATELET
Basophils Absolute: 0 10*3/uL (ref 0.0–0.2)
Basos: 1 %
EOS (ABSOLUTE): 0.1 10*3/uL (ref 0.0–0.4)
Eos: 3 %
Hematocrit: 43.2 % (ref 34.0–46.6)
Hemoglobin: 13.7 g/dL (ref 11.1–15.9)
Immature Grans (Abs): 0 10*3/uL (ref 0.0–0.1)
Immature Granulocytes: 0 %
Lymphocytes Absolute: 2 10*3/uL (ref 0.7–3.1)
Lymphs: 43 %
MCH: 26.7 pg (ref 26.6–33.0)
MCHC: 31.7 g/dL (ref 31.5–35.7)
MCV: 84 fL (ref 79–97)
Monocytes Absolute: 0.4 10*3/uL (ref 0.1–0.9)
Monocytes: 9 %
Neutrophils Absolute: 2.1 10*3/uL (ref 1.4–7.0)
Neutrophils: 44 %
Platelets: 185 10*3/uL (ref 150–450)
RBC: 5.13 x10E6/uL (ref 3.77–5.28)
RDW: 14.4 % (ref 11.7–15.4)
WBC: 4.8 10*3/uL (ref 3.4–10.8)

## 2022-05-11 LAB — THYROID PANEL WITH TSH
Free Thyroxine Index: 2 (ref 1.2–4.9)
T3 Uptake Ratio: 25 % (ref 24–39)
T4, Total: 7.8 ug/dL (ref 4.5–12.0)
TSH: 2.29 u[IU]/mL (ref 0.450–4.500)

## 2022-05-11 LAB — LIPID PANEL
Chol/HDL Ratio: 2.1 ratio (ref 0.0–4.4)
Cholesterol, Total: 182 mg/dL (ref 100–199)
HDL: 85 mg/dL (ref >39)
LDL Chol Calc (NIH): 85 mg/dL (ref 0–99)
Triglycerides: 62 mg/dL (ref 0–149)
VLDL Cholesterol Cal: 12 mg/dL (ref 5–40)

## 2022-05-11 LAB — VITAMIN D 25 HYDROXY (VIT D DEFICIENCY, FRACTURES): Vit D, 25-Hydroxy: 37.5 ng/mL (ref 30.0–100.0)

## 2022-05-16 ENCOUNTER — Other Ambulatory Visit (HOSPITAL_BASED_OUTPATIENT_CLINIC_OR_DEPARTMENT_OTHER): Payer: Self-pay

## 2022-05-17 ENCOUNTER — Other Ambulatory Visit (HOSPITAL_BASED_OUTPATIENT_CLINIC_OR_DEPARTMENT_OTHER): Payer: Self-pay

## 2022-06-25 ENCOUNTER — Other Ambulatory Visit (HOSPITAL_BASED_OUTPATIENT_CLINIC_OR_DEPARTMENT_OTHER): Payer: Self-pay

## 2022-08-14 ENCOUNTER — Other Ambulatory Visit (HOSPITAL_BASED_OUTPATIENT_CLINIC_OR_DEPARTMENT_OTHER): Payer: Self-pay

## 2022-08-14 MED ORDER — WEGOVY 1 MG/0.5ML ~~LOC~~ SOAJ
1.0000 mg | SUBCUTANEOUS | 0 refills | Status: DC
  Filled 2022-08-14 (×2): qty 2, 28d supply, fill #0

## 2022-08-19 ENCOUNTER — Other Ambulatory Visit (HOSPITAL_BASED_OUTPATIENT_CLINIC_OR_DEPARTMENT_OTHER): Payer: Self-pay

## 2022-09-11 ENCOUNTER — Other Ambulatory Visit (HOSPITAL_BASED_OUTPATIENT_CLINIC_OR_DEPARTMENT_OTHER): Payer: Self-pay

## 2022-09-11 MED ORDER — WEGOVY 1.7 MG/0.75ML ~~LOC~~ SOAJ
1.7000 mg | SUBCUTANEOUS | 0 refills | Status: DC
  Filled 2022-09-11: qty 3, 28d supply, fill #0

## 2022-10-14 ENCOUNTER — Other Ambulatory Visit (HOSPITAL_BASED_OUTPATIENT_CLINIC_OR_DEPARTMENT_OTHER): Payer: Self-pay

## 2022-10-14 MED ORDER — WEGOVY 1.7 MG/0.75ML ~~LOC~~ SOAJ
1.7000 mg | SUBCUTANEOUS | 0 refills | Status: DC
  Filled 2022-10-14 – 2022-10-21 (×2): qty 3, 28d supply, fill #0

## 2022-10-21 ENCOUNTER — Other Ambulatory Visit (HOSPITAL_BASED_OUTPATIENT_CLINIC_OR_DEPARTMENT_OTHER): Payer: Self-pay

## 2022-11-11 ENCOUNTER — Other Ambulatory Visit (HOSPITAL_BASED_OUTPATIENT_CLINIC_OR_DEPARTMENT_OTHER): Payer: Self-pay

## 2022-11-11 ENCOUNTER — Encounter (HOSPITAL_BASED_OUTPATIENT_CLINIC_OR_DEPARTMENT_OTHER): Payer: Self-pay

## 2022-11-11 MED ORDER — WEGOVY 1 MG/0.5ML ~~LOC~~ SOAJ
1.0000 mg | SUBCUTANEOUS | 0 refills | Status: DC
  Filled 2022-11-11: qty 2, 28d supply, fill #0

## 2022-11-12 ENCOUNTER — Other Ambulatory Visit (HOSPITAL_BASED_OUTPATIENT_CLINIC_OR_DEPARTMENT_OTHER): Payer: Self-pay

## 2022-11-18 ENCOUNTER — Other Ambulatory Visit (HOSPITAL_BASED_OUTPATIENT_CLINIC_OR_DEPARTMENT_OTHER): Payer: Self-pay

## 2022-11-19 ENCOUNTER — Other Ambulatory Visit (HOSPITAL_BASED_OUTPATIENT_CLINIC_OR_DEPARTMENT_OTHER): Payer: Self-pay

## 2023-01-02 ENCOUNTER — Ambulatory Visit (INDEPENDENT_AMBULATORY_CARE_PROVIDER_SITE_OTHER): Payer: BC Managed Care – PPO

## 2023-01-02 DIAGNOSIS — Z23 Encounter for immunization: Secondary | ICD-10-CM

## 2023-01-03 ENCOUNTER — Ambulatory Visit: Payer: BC Managed Care – PPO

## 2023-03-04 LAB — HM MAMMOGRAPHY

## 2023-03-07 ENCOUNTER — Encounter: Payer: Self-pay | Admitting: Nurse Practitioner

## 2023-04-04 ENCOUNTER — Ambulatory Visit: Payer: BC Managed Care – PPO

## 2023-04-11 ENCOUNTER — Ambulatory Visit (INDEPENDENT_AMBULATORY_CARE_PROVIDER_SITE_OTHER): Payer: BC Managed Care – PPO

## 2023-04-11 DIAGNOSIS — Z23 Encounter for immunization: Secondary | ICD-10-CM

## 2023-08-18 IMAGING — DX DG CHEST 2V
2 series · 2 of 2 positions shown · non-contrast
Comparison: None.

CLINICAL DATA: Annual physical.

EXAM:
CHEST - 2 VIEW

[chest pa]
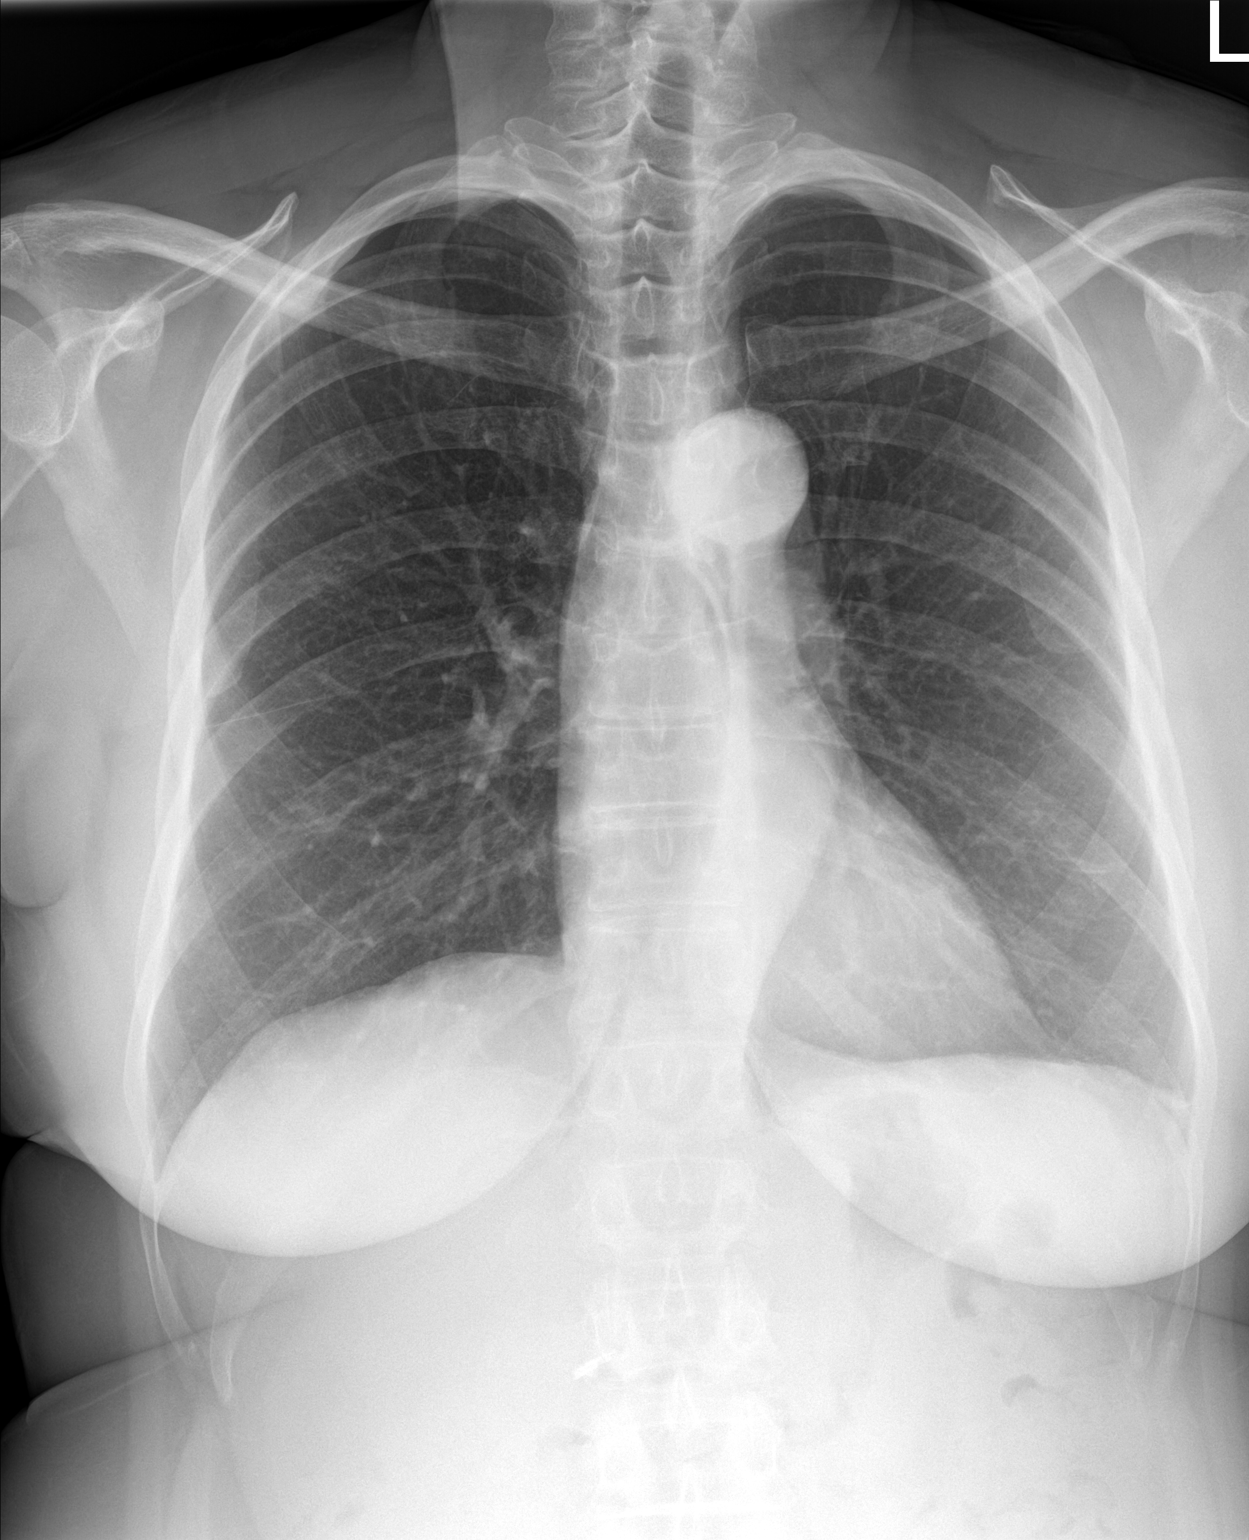

[chest lat]
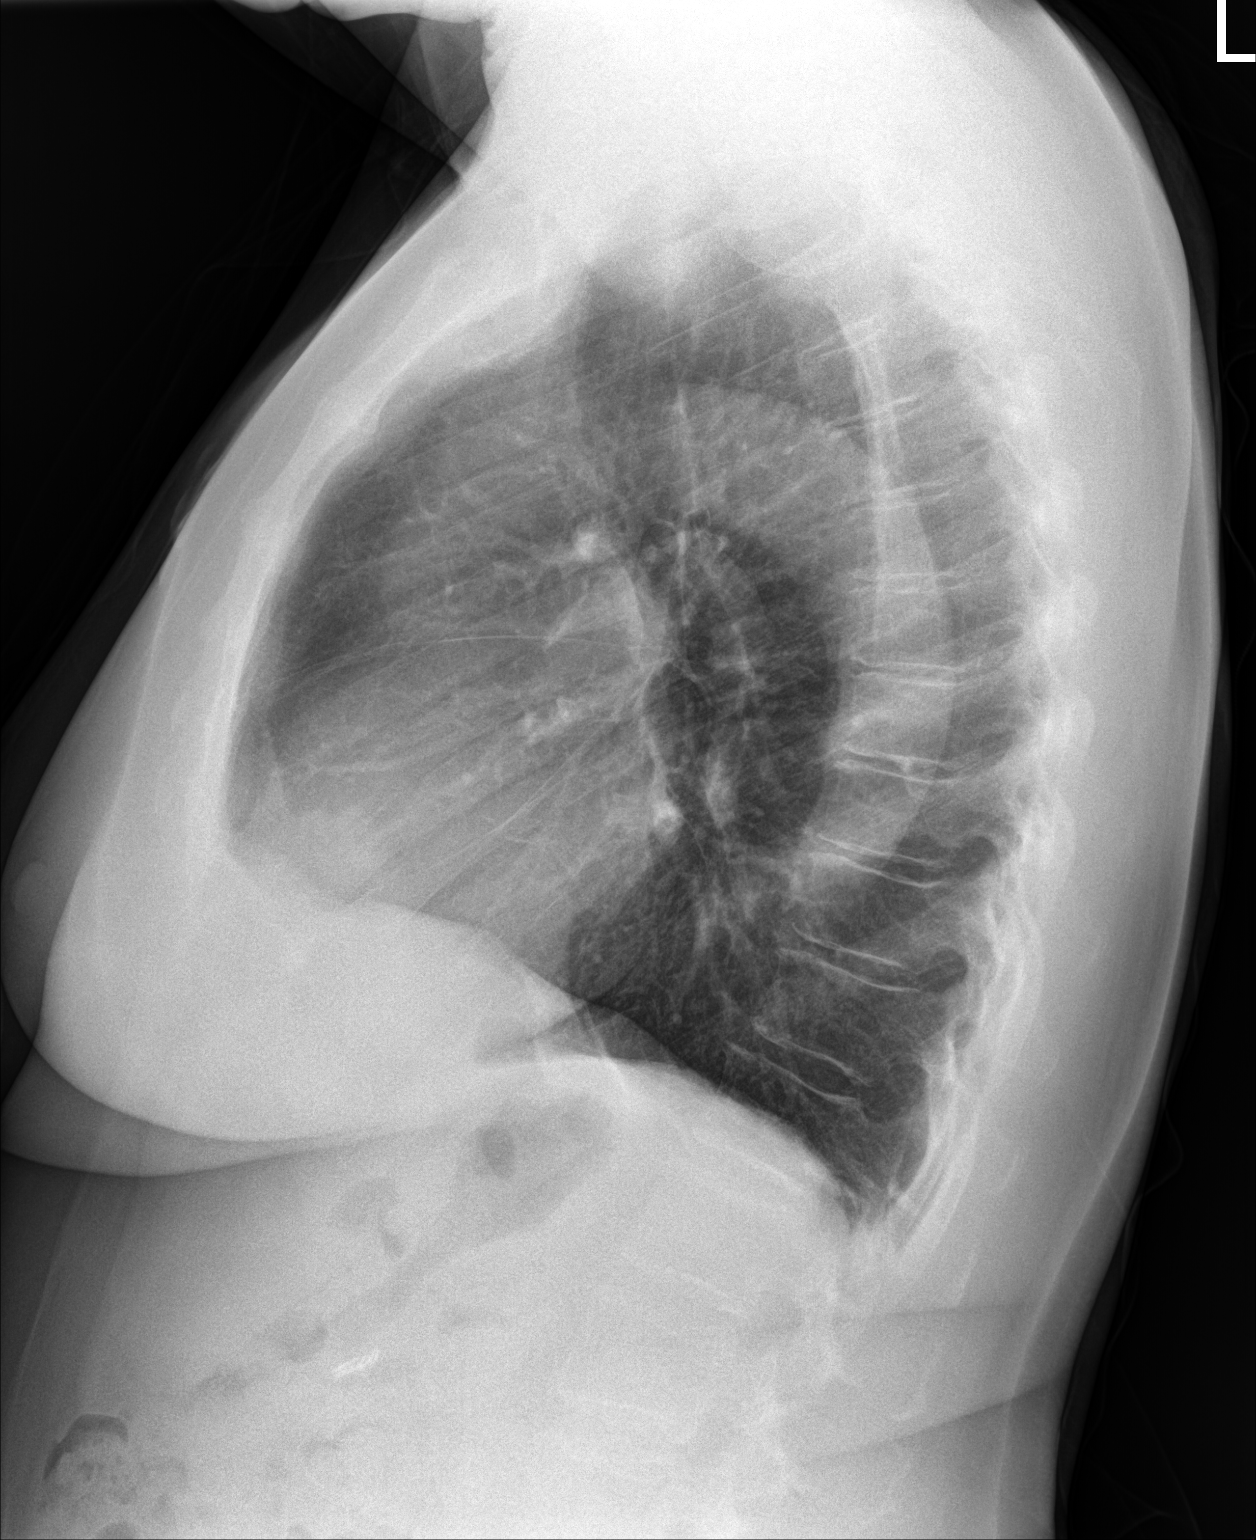

[2 of 2 positions shown; findings below may reference images not displayed]

FINDINGS: The heart size and mediastinal contours are within normal limits.
Both lungs are clear. The visualized skeletal structures are
unremarkable.
IMPRESSION: No active cardiopulmonary disease.

## 2023-08-21 ENCOUNTER — Ambulatory Visit: Payer: BC Managed Care – PPO | Admitting: Nurse Practitioner

## 2023-08-21 ENCOUNTER — Encounter: Payer: BC Managed Care – PPO | Admitting: Nurse Practitioner

## 2023-08-21 ENCOUNTER — Encounter: Payer: Self-pay | Admitting: Nurse Practitioner

## 2023-08-21 VITALS — BP 122/81 | HR 67 | Temp 98.0°F | Ht 66.0 in | Wt 181.0 lb

## 2023-08-21 DIAGNOSIS — R7303 Prediabetes: Secondary | ICD-10-CM | POA: Insufficient documentation

## 2023-08-21 DIAGNOSIS — I152 Hypertension secondary to endocrine disorders: Secondary | ICD-10-CM

## 2023-08-21 DIAGNOSIS — Z0001 Encounter for general adult medical examination with abnormal findings: Secondary | ICD-10-CM | POA: Diagnosis not present

## 2023-08-21 DIAGNOSIS — I10 Essential (primary) hypertension: Secondary | ICD-10-CM | POA: Diagnosis not present

## 2023-08-21 DIAGNOSIS — Z0000 Encounter for general adult medical examination without abnormal findings: Secondary | ICD-10-CM

## 2023-08-21 DIAGNOSIS — E1169 Type 2 diabetes mellitus with other specified complication: Secondary | ICD-10-CM | POA: Insufficient documentation

## 2023-08-21 DIAGNOSIS — E8881 Metabolic syndrome: Secondary | ICD-10-CM | POA: Diagnosis not present

## 2023-08-21 LAB — BAYER DCA HB A1C WAIVED: HB A1C (BAYER DCA - WAIVED): 5.5 % (ref 4.8–5.6)

## 2023-08-21 LAB — LIPID PANEL

## 2023-08-21 MED ORDER — LOSARTAN POTASSIUM 50 MG PO TABS
50.0000 mg | ORAL_TABLET | Freq: Every day | ORAL | 1 refills | Status: DC
Start: 2023-08-21 — End: 2024-02-09

## 2023-08-21 MED ORDER — METFORMIN HCL ER 500 MG PO TB24
500.0000 mg | ORAL_TABLET | Freq: Every day | ORAL | 1 refills | Status: DC
Start: 2023-08-21 — End: 2024-02-09

## 2023-08-21 NOTE — Progress Notes (Signed)
Subjective:    Patient ID: Heather Mcclain, female    DOB: 26-Oct-1960, 63 y.o.   MRN: [id number]   Chief Complaint: Annual Exam (No pap)    HPI:  Heather Mcclain is a 63 y.o. who identifies as a female who was assigned female at birth.    Patient in today for annual physical. She is doing well with out complaints. Her chronic issues are follow ed by [facility] nurse practitioner. - hypertension- no c/o chest pain, sob or headache. She is on cozaar 50mg  daily and is doing well. - Pre diabetes- is on metfoimin and is doing well.   There are no diagnoses linked to this encounter.  New complaints: None today  Allergies  Allergen Reactions   Penicillins Rash   Outpatient Encounter Medications as of 08/21/2023  Medication Sig   cromolyn (OPTICROM) 4 % ophthalmic solution 1 drop 2 (two) times daily.   ergocalciferol (VITAMIN D2) 1.25 MG (50000 UT) capsule Take 50,000 Units by mouth once a week.   Loratadine (CLARITIN PO) Take by mouth.   losartan (COZAAR) 50 MG tablet Take 50 mg by mouth daily.   magnesium oxide (MAG-OX) 400 (240 Mg) MG tablet Take 400 mg by mouth daily.   metFORMIN (GLUCOPHAGE-XR) 500 MG 24 hr tablet Take 500 mg by mouth daily with breakfast.   prednisoLONE acetate (PRED FORTE) 1 % ophthalmic suspension SMARTSIG:In Eye(s)   [DISCONTINUED] Semaglutide-Weight Management (WEGOVY) 0.25 MG/0.5ML SOAJ Inject 0.25 mg under the skin once weekly.   [DISCONTINUED] Semaglutide-Weight Management (WEGOVY) 1 MG/0.5ML SOAJ Inject 1 mg into the skin once a week.   [DISCONTINUED] Semaglutide-Weight Management (WEGOVY) 1.7 MG/0.75ML SOAJ Inject 1.7 mg into the skin once a week.   No facility-administered encounter medications on file as of 08/21/2023.    Past Surgical History:  Procedure Laterality Date   GALLBLADDER SURGERY     TUBAL LIGATION      Family History  Problem Relation Age of Onset   Hypertension Father    Diabetes Father       Controlled substance  contract: n/a     Review of Systems  Constitutional:  Negative for diaphoresis.  Eyes:  Negative for pain.  Respiratory:  Negative for shortness of breath.   Cardiovascular:  Negative for chest pain, palpitations and leg swelling.  Gastrointestinal:  Negative for abdominal pain.  Endocrine: Negative for polydipsia.  Skin:  Negative for rash.  Neurological:  Negative for dizziness, weakness and headaches.  Hematological:  Does not bruise/bleed easily.  All other systems reviewed and are negative.      Objective:   Physical Exam Vitals and nursing note reviewed.  Constitutional:      General: She is not in acute distress.    Appearance: Normal appearance. She is well-developed.  HENT:     Head: Normocephalic.     Right Ear: Tympanic membrane normal.     Left Ear: Tympanic membrane normal.     Nose: Nose normal.     Mouth/Throat:     Mouth: Mucous membranes are moist.  Eyes:     Pupils: Pupils are equal, round, and reactive to light.  Neck:     Vascular: No carotid bruit or JVD.  Cardiovascular:     Rate and Rhythm: Normal rate and regular rhythm.     Heart sounds: Normal heart sounds.  Pulmonary:     Effort: Pulmonary effort is normal. No respiratory distress.     Breath sounds: Normal breath sounds. No wheezing or rales.  Chest:     Chest wall: No tenderness.  Abdominal:     General: Bowel sounds are normal. There is no distension or abdominal bruit.     Palpations: Abdomen is soft. There is no hepatomegaly, splenomegaly, mass or pulsatile mass.     Tenderness: There is no abdominal tenderness.  Musculoskeletal:        General: Normal range of motion.     Cervical back: Normal range of motion and neck supple.  Lymphadenopathy:     Cervical: No cervical adenopathy.  Skin:    General: Skin is warm and dry.  Neurological:     Mental Status: She is alert and oriented to person, place, and time.     Deep Tendon Reflexes: Reflexes are normal and symmetric.   Psychiatric:        Behavior: Behavior normal.        Thought Content: Thought content normal.        Judgment: Judgment normal.    BP 122/81   Pulse 67   Temp 98 F (36.7 C) (Temporal)   Ht 5\' 6"  (1.676 m)   Wt 181 lb (82.1 kg)   SpO2 97%   BMI 29.21 kg/m         Assessment & Plan:   Heather Mcclain comes in today with chief complaint of Annual Exam (No pap)   Diagnosis and orders addressed:  1. Annual physical exam (Primary)  2. Hypertension associated with diabetes (HCC) Low sodium diet - losartan (COZAAR) 50 MG tablet; Take 1 tablet (50 mg total) by mouth daily.  Dispense: 90 tablet; Refill: 1  3. Prediabetes Low carb diet - metFORMIN (GLUCOPHAGE-XR) 500 MG 24 hr tablet; Take 1 tablet (500 mg total) by mouth daily with breakfast.  Dispense: 90 tablet; Refill: 1   Labs pending Health Maintenance reviewed Diet and exercise encouraged  Follow up plan: 6 months   Heather Daphine Deutscher, FNP

## 2023-08-21 NOTE — Patient Instructions (Signed)

## 2023-08-22 LAB — CBC WITH DIFFERENTIAL/PLATELET
Basophils Absolute: 0 10*3/uL (ref 0.0–0.2)
Basos: 1 %
EOS (ABSOLUTE): 0.3 10*3/uL (ref 0.0–0.4)
Eos: 5 %
Hematocrit: 42.5 % (ref 34.0–46.6)
Hemoglobin: 13.3 g/dL (ref 11.1–15.9)
Immature Grans (Abs): 0 10*3/uL (ref 0.0–0.1)
Immature Granulocytes: 0 %
Lymphocytes Absolute: 2.4 10*3/uL (ref 0.7–3.1)
Lymphs: 48 %
MCH: 26.8 pg (ref 26.6–33.0)
MCHC: 31.3 g/dL — ABNORMAL LOW (ref 31.5–35.7)
MCV: 86 fL (ref 79–97)
Monocytes Absolute: 0.5 10*3/uL (ref 0.1–0.9)
Monocytes: 9 %
Neutrophils Absolute: 1.9 10*3/uL (ref 1.4–7.0)
Neutrophils: 37 %
Platelets: 223 10*3/uL (ref 150–450)
RBC: 4.97 x10E6/uL (ref 3.77–5.28)
RDW: 14.6 % (ref 11.7–15.4)
WBC: 5.1 10*3/uL (ref 3.4–10.8)

## 2023-08-22 LAB — LIPID PANEL
Cholesterol, Total: 216 mg/dL — ABNORMAL HIGH (ref 100–199)
HDL: 100 mg/dL (ref >39)
LDL CALC COMMENT:: 2.2 ratio (ref 0.0–4.4)
LDL Chol Calc (NIH): 103 mg/dL — ABNORMAL HIGH (ref 0–99)
Triglycerides: 72 mg/dL (ref 0–149)
VLDL Cholesterol Cal: 13 mg/dL (ref 5–40)

## 2023-08-22 LAB — CMP14+EGFR
ALT: 17 IU/L (ref 0–32)
AST: 19 IU/L (ref 0–40)
Albumin: 4.4 g/dL (ref 3.9–4.9)
Alkaline Phosphatase: 117 IU/L (ref 44–121)
BUN/Creatinine Ratio: 10 — ABNORMAL LOW (ref 12–28)
BUN: 8 mg/dL (ref 8–27)
Bilirubin Total: 0.3 mg/dL (ref 0.0–1.2)
CO2: 23 mmol/L (ref 20–29)
Calcium: 9.8 mg/dL (ref 8.7–10.3)
Chloride: 106 mmol/L (ref 96–106)
Creatinine, Ser: 0.81 mg/dL (ref 0.57–1.00)
Globulin, Total: 2.5 g/dL (ref 1.5–4.5)
Glucose: 91 mg/dL (ref 70–99)
Potassium: 4.2 mmol/L (ref 3.5–5.2)
Sodium: 143 mmol/L (ref 134–144)
Total Protein: 6.9 g/dL (ref 6.0–8.5)
eGFR: 82 mL/min/{1.73_m2} (ref >59)

## 2023-08-22 LAB — THYROID PANEL WITH TSH
Free Thyroxine Index: 1.7 (ref 1.2–4.9)
T3 Uptake Ratio: 23 % — ABNORMAL LOW (ref 24–39)
T4, Total: 7.2 ug/dL (ref 4.5–12.0)
TSH: 3.74 u[IU]/mL (ref 0.450–4.500)

## 2023-11-26 ENCOUNTER — Telehealth: Payer: Self-pay | Admitting: Nurse Practitioner

## 2023-12-24 ENCOUNTER — Telehealth: Payer: Self-pay | Admitting: Nurse Practitioner

## 2023-12-24 NOTE — Telephone Encounter (Signed)
 Left message on voicemail to call back and make diabetic eye exam if she hasn't had one.

## 2024-01-01 ENCOUNTER — Encounter: Payer: Self-pay | Admitting: Nurse Practitioner

## 2024-01-01 ENCOUNTER — Ambulatory Visit (INDEPENDENT_AMBULATORY_CARE_PROVIDER_SITE_OTHER): Admitting: Nurse Practitioner

## 2024-01-01 VITALS — BP 115/70 | HR 68 | Temp 97.6°F | Ht 66.0 in | Wt 172.6 lb

## 2024-01-01 DIAGNOSIS — M109 Gout, unspecified: Secondary | ICD-10-CM | POA: Insufficient documentation

## 2024-01-01 MED ORDER — COLCHICINE 0.6 MG PO TABS: 0.6000 mg | ORAL_TABLET | Freq: Every day | ORAL | 0 refills | Status: DC

## 2024-01-01 NOTE — Progress Notes (Signed)
 Acute Office Visit  Subjective:     Patient ID: Heather Mcclain, female    DOB: 09-22-1960, 63 y.o.   MRN: [id number]  Chief Complaint  Patient presents with   Foot Swelling    Right foot pain and swelling to great toe for 4 days, had 2 recent tick bites unsure if related     HPI Heather Mcclain is a 63 yrs old female presents fro an acute concerns for gout  Gout: Patient here for evaluation of acute gouty arthritis. The patient reports onset of an acute gout attack involving the right big toe beginning 3 days ago, and being treated with none. Attacks occur primarily in the right big toe. Patient reports her chronic pain is worse, her joint stiffness is unchanged and her joint swelling is unchanged. Limitation on activities include difficulty with walking. The patient is not avoiding high purine foods and reports   Active Ambulatory Problems    Diagnosis Date Noted   S/P laparoscopic cholecystectomy 01/20/2014   Vitamin D  deficiency 06/30/2019   BMI 29.0-29.9,adult 06/30/2019   Hypertension associated with diabetes (HCC) 08/21/2023   Prediabetes 08/21/2023   Acute gout involving toe of right foot 01/01/2024   Resolved Ambulatory Problems    Diagnosis Date Noted   Calculus of gallbladder without cholecystitis without obstruction 01/20/2014   No Additional Past Medical History    Review of Systems  Constitutional:  Negative for chills and fever.  Cardiovascular:  Negative for chest pain and leg swelling.  Gastrointestinal:  Negative for nausea and vomiting.  Musculoskeletal:        Pain in right big toe 7/10 burning throbbing  Skin:  Negative for itching and rash.  Neurological:  Negative for dizziness and headaches.   Negative unless indicated in HPI    Objective:    BP 115/70   Pulse 68   Temp 97.6 F (36.4 C) (Temporal)   Ht 5' 6 (1.676 m)   Wt 172 lb 9.6 oz (78.3 kg)   SpO2 97%   BMI 27.86 kg/m  BP Readings from Last 3 Encounters:  01/01/24  115/70  08/21/23 122/81  05/10/22 139/89   Wt Readings from Last 3 Encounters:  01/01/24 172 lb 9.6 oz (78.3 kg)  08/21/23 181 lb (82.1 kg)  05/10/22 192 lb (87.1 kg)      Physical Exam Vitals and nursing note reviewed.  HENT:     Head: Normocephalic and atraumatic.     Nose: Nose normal.     Mouth/Throat:     Mouth: Mucous membranes are moist.   Eyes:     Extraocular Movements: Extraocular movements intact.     Conjunctiva/sclera: Conjunctivae normal.     Pupils: Pupils are equal, round, and reactive to light.    Cardiovascular:     Heart sounds: Normal heart sounds.  Pulmonary:     Effort: Pulmonary effort is normal.     Breath sounds: Normal breath sounds.   Musculoskeletal:        General: Normal range of motion.     Right lower leg: No edema.     Left lower leg: No edema.   Skin:    General: Skin is warm and dry.     Findings: No rash.   Neurological:     Mental Status: She is alert and oriented to person, place, and time.   Psychiatric:        Mood and Affect: Mood normal.        Behavior: Behavior normal.  Thought Content: Thought content normal.        Judgment: Judgment normal.     No results found for any visits on 01/01/24.      Assessment & Plan:  Acute gout involving toe of right foot, unspecified cause -     Uric acid  Other orders -     Colchicine; Take 1 tablet (0.6 mg total) by mouth daily.  Dispense: 30 tablet; Refill: 0    Heather Mcclain is a 63 yrs old African American female Gout attack: Uric acid level result pending, colchicine 0.6 mg daily of The nature of gout is fully explained, including dietary relationship, acute and interval phase and treatment of both. Long term complications such as kidney stones, tophi and arthritis are discussed. Avoidance of alcohol recommended, and written literature is given along with a low purine diet. Indications for the use of allopurinol for prophylaxis and the use of colchicine to prevent or  treat flare-ups is also discussed. Proper use of indomethacin for acute attacks discussed, and its side effects. Call if further attacks occur, or this one does not resolve promptly.    The above assessment and management plan was discussed with the patient. The patient verbalized understanding of and has agreed to the management plan. Patient is aware to call the clinic if they develop any new symptoms or if symptoms persist or worsen. Patient is aware when to return to the clinic for a follow-up visit. Patient educated on when it is appropriate to go to the emergency department.  Return if symptoms worsen or fail to improve.  Heather Hoston [place] Thompson, DNP [facility] [address] [place], [place] [address] [phone]  Note: This document was prepared by [employer] voice dictation technology and any errors that results from this process are unintentional.

## 2024-01-02 LAB — URIC ACID: Uric Acid: 4.5 mg/dL (ref 3.0–7.2)

## 2024-01-05 ENCOUNTER — Ambulatory Visit: Payer: Self-pay | Admitting: Nurse Practitioner

## 2024-01-23 ENCOUNTER — Other Ambulatory Visit: Payer: Self-pay | Admitting: Nurse Practitioner

## 2024-02-09 ENCOUNTER — Encounter: Payer: Self-pay | Admitting: Nurse Practitioner

## 2024-02-09 ENCOUNTER — Ambulatory Visit (INDEPENDENT_AMBULATORY_CARE_PROVIDER_SITE_OTHER): Payer: BC Managed Care – PPO | Admitting: Nurse Practitioner

## 2024-02-09 VITALS — BP 143/83 | HR 56 | Temp 97.9°F | Ht 66.0 in | Wt 173.0 lb

## 2024-02-09 DIAGNOSIS — Z7984 Long term (current) use of oral hypoglycemic drugs: Secondary | ICD-10-CM

## 2024-02-09 DIAGNOSIS — M1A00X0 Idiopathic chronic gout, unspecified site, without tophus (tophi): Secondary | ICD-10-CM

## 2024-02-09 DIAGNOSIS — Z6829 Body mass index (BMI) 29.0-29.9, adult: Secondary | ICD-10-CM

## 2024-02-09 DIAGNOSIS — R946 Abnormal results of thyroid function studies: Secondary | ICD-10-CM

## 2024-02-09 DIAGNOSIS — E1159 Type 2 diabetes mellitus with other circulatory complications: Secondary | ICD-10-CM

## 2024-02-09 DIAGNOSIS — E559 Vitamin D deficiency, unspecified: Secondary | ICD-10-CM

## 2024-02-09 DIAGNOSIS — E8881 Metabolic syndrome: Secondary | ICD-10-CM | POA: Insufficient documentation

## 2024-02-09 DIAGNOSIS — I152 Hypertension secondary to endocrine disorders: Secondary | ICD-10-CM

## 2024-02-09 LAB — BAYER DCA HB A1C WAIVED: HB A1C (BAYER DCA - WAIVED): 5.6 % (ref 4.8–5.6)

## 2024-02-09 MED ORDER — METFORMIN HCL ER 500 MG PO TB24: 500.0000 mg | ORAL_TABLET | Freq: Every day | ORAL | 1 refills | Status: DC

## 2024-02-09 MED ORDER — LOSARTAN POTASSIUM 50 MG PO TABS: 50.0000 mg | ORAL_TABLET | Freq: Every day | ORAL | 1 refills | Status: DC

## 2024-02-09 MED ORDER — ALLOPURINOL 100 MG PO TABS: 100.0000 mg | ORAL_TABLET | Freq: Every day | ORAL | 1 refills | Status: AC

## 2024-02-09 NOTE — Progress Notes (Signed)
 Subjective:    Patient ID: Heather Mcclain, female    DOB: January 26, 1961, 63 y.o.   MRN: [id number]   Chief Complaint: medical management of chronic issues     HPI:  Heather Mcclain is a 63 y.o. who identifies as a female who was assigned female at birth.   Social history: Lives with: husband and daughter   Comes in today for follow up of the following chronic medical issues:  1. Vitamin D  deficiency Is on weekly vitamin d  supplement  2. Metabolic syndrome Does not check blood sugars at home. Does not watch diet very closely Lab Results  Component Value Date   HGBA1C 5.5 08/21/2023   3. hypertension No c/o chest pain, sob or headache. Does not check blood pressure at home. BP Readings from Last 3 Encounters:  02/09/24 (!) 143/83  01/01/24 115/70  08/21/23 122/81     4. MI 30.0-30.9,adult No recent weight changes- she is on wegovy   Wt Readings from Last 3 Encounters:  02/09/24 173 lb (78.5 kg)  01/01/24 172 lb 9.6 oz (78.3 kg)  08/21/23 181 lb (82.1 kg)   BMI Readings from Last 3 Encounters:  02/09/24 27.92 kg/m  01/01/24 27.86 kg/m  08/21/23 29.21 kg/m       New complaints: Has had recent gout flare ups. Has been taking colchicine  daily. Is asking about allopurinol .  Allergies  Allergen Reactions   Penicillins Rash   Outpatient Encounter Medications as of 02/09/2024  Medication Sig   colchicine  0.6 MG tablet TAKE 1 TABLET BY MOUTH EVERY DAY   cromolyn (OPTICROM) 4 % ophthalmic solution 1 drop 2 (two) times daily.   ergocalciferol  (VITAMIN D2) 1.25 MG (50000 UT) capsule Take 50,000 Units by mouth once a week.   Loratadine (CLARITIN PO) Take by mouth.   losartan  (COZAAR ) 50 MG tablet Take 1 tablet (50 mg total) by mouth daily.   magnesium oxide (MAG-OX) 400 (240 Mg) MG tablet Take 400 mg by mouth daily.   metFORMIN  (GLUCOPHAGE -XR) 500 MG 24 hr tablet Take 1 tablet (500 mg total) by mouth daily with breakfast.   prednisoLONE acetate (PRED  FORTE) 1 % ophthalmic suspension SMARTSIG:In Eye(s)   No facility-administered encounter medications on file as of 02/09/2024.    Past Surgical History:  Procedure Laterality Date   GALLBLADDER SURGERY     TUBAL LIGATION      Family History  Problem Relation Age of Onset   Hypertension Father    Diabetes Father       Controlled substance contract: n/a     Review of Systems  Constitutional:  Negative for diaphoresis.  Eyes:  Negative for pain.  Respiratory:  Negative for shortness of breath.   Cardiovascular:  Negative for chest pain, palpitations and leg swelling.  Gastrointestinal:  Negative for abdominal pain.  Endocrine: Negative for polydipsia.  Skin:  Negative for rash.  Neurological:  Negative for dizziness, weakness and headaches.  Hematological:  Does not bruise/bleed easily.  All other systems reviewed and are negative.      Objective:   Physical Exam Vitals and nursing note reviewed.  Constitutional:      General: She is not in acute distress.    Appearance: Normal appearance. She is well-developed.  HENT:     Head: Normocephalic.     Right Ear: Tympanic membrane normal.     Left Ear: Tympanic membrane normal.     Nose: Nose normal.     Mouth/Throat:     Mouth: Mucous membranes are  moist.  Eyes:     Pupils: Pupils are equal, round, and reactive to light.  Neck:     Vascular: No carotid bruit or JVD.  Cardiovascular:     Rate and Rhythm: Normal rate and regular rhythm.     Heart sounds: Normal heart sounds.  Pulmonary:     Effort: Pulmonary effort is normal. No respiratory distress.     Breath sounds: Normal breath sounds. No wheezing or rales.  Chest:     Chest wall: No tenderness.  Abdominal:     General: Bowel sounds are normal. There is no distension or abdominal bruit.     Palpations: Abdomen is soft. There is no hepatomegaly, splenomegaly, mass or pulsatile mass.     Tenderness: There is no abdominal tenderness.  Musculoskeletal:         General: Normal range of motion.     Cervical back: Normal range of motion and neck supple.  Lymphadenopathy:     Cervical: No cervical adenopathy.  Skin:    General: Skin is warm and dry.  Neurological:     Mental Status: She is alert and oriented to person, place, and time.     Deep Tendon Reflexes: Reflexes are normal and symmetric.  Psychiatric:        Behavior: Behavior normal.        Thought Content: Thought content normal.        Judgment: Judgment normal.     BP (!) 143/83   Pulse (!) 56   Temp 97.9 F (36.6 C) (Temporal)   Ht 5' 6 (1.676 m)   Wt 173 lb (78.5 kg)   SpO2 99%   BMI 27.92 kg/m    Hgba1c 5.6%      Assessment & Plan:  Heather Mcclain comes in today with chief complaint of Medical Management of Chronic Issues   Diagnosis and orders addressed:  1. Abnormal thyroid  function test (Primary) Lab spending - Thyroid  Panel With TSH  2. Metabolic syndrome Continue  to watch carbs in diet Continue exercise - Bayer DCA Hb A1c Waived - Lipid panel - Vitamin B12 - metFORMIN  (GLUCOPHAGE -XR) 500 MG 24 hr tablet; Take 1 tablet (500 mg total) by mouth daily with breakfast.  Dispense: 90 tablet; Refill: 1  3. Hypertension associated with diabetes (HCC) Dash diet - CBC with Differential/Platelet - CMP14+EGFR - losartan  (COZAAR ) 50 MG tablet; Take 1 tablet (50 mg total) by mouth daily.  Dispense: 90 tablet; Refill: 1  4. BMI 29.0-29.9,adult Discussed diet and exercise for person with BMI >25 Will recheck weight in 3-6 months   5. Vitamin D  deficiency Labs pending  6. Idiopathic chronic gout without tophus, unspecified site Will try allopurinol - if does not help - probably arthritis in feet - allopurinol  (ZYLOPRIM ) 100 MG tablet; Take 1 tablet (100 mg total) by mouth daily.  Dispense: 90 tablet; Refill: 1   Labs pending Health Maintenance reviewed Diet and exercise encouraged  Follow up plan: 6 months   Mary-Margaret Gladis, FNP

## 2024-02-09 NOTE — Patient Instructions (Signed)

## 2024-02-10 ENCOUNTER — Ambulatory Visit: Payer: Self-pay | Admitting: Nurse Practitioner

## 2024-02-10 LAB — CBC WITH DIFFERENTIAL/PLATELET
Basophils Absolute: 0 x10E3/uL (ref 0.0–0.2)
Basos: 1 %
EOS (ABSOLUTE): 0.1 x10E3/uL (ref 0.0–0.4)
Eos: 2 %
Hematocrit: 42.1 % (ref 34.0–46.6)
Hemoglobin: 13.3 g/dL (ref 11.1–15.9)
Immature Grans (Abs): 0 x10E3/uL (ref 0.0–0.1)
Immature Granulocytes: 0 %
Lymphocytes Absolute: 2.1 x10E3/uL (ref 0.7–3.1)
Lymphs: 45 %
MCH: 27 pg (ref 26.6–33.0)
MCHC: 31.6 g/dL (ref 31.5–35.7)
MCV: 85 fL (ref 79–97)
Monocytes Absolute: 0.5 x10E3/uL (ref 0.1–0.9)
Monocytes: 10 %
Neutrophils Absolute: 2 x10E3/uL (ref 1.4–7.0)
Neutrophils: 42 %
Platelets: 235 x10E3/uL (ref 150–450)
RBC: 4.93 x10E6/uL (ref 3.77–5.28)
RDW: 14.9 % (ref 11.7–15.4)
WBC: 4.8 x10E3/uL (ref 3.4–10.8)

## 2024-02-10 LAB — CMP14+EGFR
ALT: 19 IU/L (ref 0–32)
AST: 23 IU/L (ref 0–40)
Albumin: 4.5 g/dL (ref 3.9–4.9)
Alkaline Phosphatase: 131 IU/L — ABNORMAL HIGH (ref 44–121)
BUN/Creatinine Ratio: 12 (ref 12–28)
BUN: 9 mg/dL (ref 8–27)
Bilirubin Total: 0.3 mg/dL (ref 0.0–1.2)
CO2: 22 mmol/L (ref 20–29)
Calcium: 9.8 mg/dL (ref 8.7–10.3)
Chloride: 106 mmol/L (ref 96–106)
Creatinine, Ser: 0.77 mg/dL (ref 0.57–1.00)
Globulin, Total: 2.3 g/dL (ref 1.5–4.5)
Glucose: 82 mg/dL (ref 70–99)
Potassium: 4.1 mmol/L (ref 3.5–5.2)
Sodium: 143 mmol/L (ref 134–144)
Total Protein: 6.8 g/dL (ref 6.0–8.5)
eGFR: 87 mL/min/1.73 (ref >59)

## 2024-02-10 LAB — THYROID PANEL WITH TSH
Free Thyroxine Index: 1.5 (ref 1.2–4.9)
T3 Uptake Ratio: 23 — AB (ref 24–39)
T4, Total: 6.7 ug/dL (ref 4.5–12.0)
TSH: 3.45 u[IU]/mL (ref 0.450–4.500)

## 2024-02-10 LAB — LIPID PANEL
Chol/HDL Ratio: 2.5 ratio (ref 0.0–4.4)
Cholesterol, Total: 212 mg/dL — ABNORMAL HIGH (ref 100–199)
HDL: 85 mg/dL (ref >39)
LDL Chol Calc (NIH): 105 mg/dL — ABNORMAL HIGH (ref 0–99)
Triglycerides: 127 mg/dL (ref 0–149)
VLDL Cholesterol Cal: 22 mg/dL (ref 5–40)

## 2024-02-10 LAB — VITAMIN B12: Vitamin B-12: 702 pg/mL (ref 232–1245)

## 2024-08-10 ENCOUNTER — Ambulatory Visit: Payer: Self-pay | Admitting: Nurse Practitioner

## 2024-08-12 ENCOUNTER — Ambulatory Visit: Payer: Self-pay | Admitting: Nurse Practitioner

## 2024-08-12 ENCOUNTER — Encounter: Payer: Self-pay | Admitting: Nurse Practitioner

## 2024-08-12 VITALS — BP 133/83 | HR 66 | Ht 66.0 in | Wt 175.0 lb

## 2024-08-12 DIAGNOSIS — M10071 Idiopathic gout, right ankle and foot: Secondary | ICD-10-CM

## 2024-08-12 DIAGNOSIS — Z7984 Long term (current) use of oral hypoglycemic drugs: Secondary | ICD-10-CM

## 2024-08-12 DIAGNOSIS — E559 Vitamin D deficiency, unspecified: Secondary | ICD-10-CM

## 2024-08-12 DIAGNOSIS — I152 Hypertension secondary to endocrine disorders: Secondary | ICD-10-CM

## 2024-08-12 DIAGNOSIS — E1169 Type 2 diabetes mellitus with other specified complication: Secondary | ICD-10-CM

## 2024-08-12 DIAGNOSIS — Z6829 Body mass index (BMI) 29.0-29.9, adult: Secondary | ICD-10-CM

## 2024-08-12 DIAGNOSIS — E8881 Metabolic syndrome: Secondary | ICD-10-CM

## 2024-08-12 DIAGNOSIS — E1159 Type 2 diabetes mellitus with other circulatory complications: Secondary | ICD-10-CM

## 2024-08-12 DIAGNOSIS — Z0001 Encounter for general adult medical examination with abnormal findings: Secondary | ICD-10-CM

## 2024-08-12 DIAGNOSIS — Z1211 Encounter for screening for malignant neoplasm of colon: Secondary | ICD-10-CM

## 2024-08-12 LAB — BAYER DCA HB A1C WAIVED: HB A1C (BAYER DCA - WAIVED): 5.6 % (ref 4.8–5.6)

## 2024-08-12 MED ORDER — METFORMIN HCL ER 500 MG PO TB24: 500.0000 mg | ORAL_TABLET | Freq: Every day | ORAL | 1 refills | Status: AC

## 2024-08-12 MED ORDER — LOSARTAN POTASSIUM 50 MG PO TABS: 50.0000 mg | ORAL_TABLET | Freq: Every day | ORAL | 1 refills | Status: AC

## 2024-08-12 NOTE — Progress Notes (Signed)
 "  Subjective:    Patient ID: Heather Mcclain, female    DOB: 1960/09/20, 64 y.o.   MRN: [id number]   Chief Complaint: annual physical   HPI:  Heather Mcclain is a 64 y.o. who identifies as a female who was assigned female at birth.   Social history: Lives with: husband and daugter Work history: retired   Water Engineer in today for follow up of the following chronic medical issues:  1. Hypertension associated with diabetes (HCC) No c/o chest pain, sob or headache Does not check blood pressure at home BP Readings from Last 3 Encounters:  02/09/24 (!) 143/83  01/01/24 115/70  08/21/23 122/81     2. Type 2 diabetes mellitus with other specified complication, without long-term current use of insulin (HCC) Does not check blood sugars at home Is on metformin  daily Lab Results  Component Value Date   HGBA1C 5.6 02/09/2024     3. Acute idiopathic gout involving toe of right foot No recent flare ups  4. Vitamin D  deficiency Is not on vitamin d  supplement lately  5. BMI 29.0-29.9,adult Wt Readings from Last 3 Encounters:  08/12/24 175 lb (79.4 kg)  02/09/24 173 lb (78.5 kg)  01/01/24 172 lb 9.6 oz (78.3 kg)   BMI Readings from Last 3 Encounters:  08/12/24 28.25 kg/m  02/09/24 27.92 kg/m  01/01/24 27.86 kg/m      New complaints: None today  Allergies[1] Outpatient Encounter Medications as of 08/12/2024  Medication Sig   allopurinol  (ZYLOPRIM ) 100 MG tablet Take 1 tablet (100 mg total) by mouth daily.   colchicine  0.6 MG tablet TAKE 1 TABLET BY MOUTH EVERY DAY   cromolyn (OPTICROM) 4 % ophthalmic solution 1 drop 2 (two) times daily.   ergocalciferol  (VITAMIN D2) 1.25 MG (50000 UT) capsule Take 50,000 Units by mouth once a week.   Loratadine (CLARITIN PO) Take by mouth.   losartan  (COZAAR ) 50 MG tablet Take 1 tablet (50 mg total) by mouth daily.   magnesium oxide (MAG-OX) 400 (240 Mg) MG tablet Take 400 mg by mouth daily.   metFORMIN  (GLUCOPHAGE -XR) 500 MG  24 hr tablet Take 1 tablet (500 mg total) by mouth daily with breakfast.   prednisoLONE acetate (PRED FORTE) 1 % ophthalmic suspension SMARTSIG:In Eye(s)   No facility-administered encounter medications on file as of 08/12/2024.    Past Surgical History:  Procedure Laterality Date   GALLBLADDER SURGERY     TUBAL LIGATION      Family History  Problem Relation Age of Onset   Hypertension Father    Diabetes Father       Controlled substance contract: n/a      Review of Systems  Constitutional:  Negative for diaphoresis.  Eyes:  Negative for pain.  Respiratory:  Negative for shortness of breath.   Cardiovascular:  Negative for chest pain, palpitations and leg swelling.  Gastrointestinal:  Negative for abdominal pain.  Endocrine: Negative for polydipsia.  Skin:  Negative for rash.  Neurological:  Negative for dizziness, weakness and headaches.  Hematological:  Does not bruise/bleed easily.  All other systems reviewed and are negative.      Objective:   Physical Exam Vitals and nursing note reviewed.  Constitutional:      General: She is not in acute distress.    Appearance: Normal appearance. She is well-developed.  HENT:     Head: Normocephalic.     Right Ear: Tympanic membrane normal.     Left Ear: Tympanic membrane normal.  Nose: Nose normal.     Mouth/Throat:     Mouth: Mucous membranes are moist.  Eyes:     Pupils: Pupils are equal, round, and reactive to light.  Neck:     Vascular: No carotid bruit or JVD.  Cardiovascular:     Rate and Rhythm: Normal rate and regular rhythm.     Heart sounds: Normal heart sounds.  Pulmonary:     Effort: Pulmonary effort is normal. No respiratory distress.     Breath sounds: Normal breath sounds. No wheezing or rales.  Chest:     Chest wall: No tenderness.  Abdominal:     General: Bowel sounds are normal. There is no distension or abdominal bruit.     Palpations: Abdomen is soft. There is no hepatomegaly,  splenomegaly, mass or pulsatile mass.     Tenderness: There is no abdominal tenderness. There is left CVA tenderness.  Musculoskeletal:        General: Normal range of motion.     Cervical back: Normal range of motion and neck supple.  Lymphadenopathy:     Cervical: No cervical adenopathy.  Skin:    General: Skin is warm and dry.  Neurological:     Mental Status: She is alert and oriented to person, place, and time.     Deep Tendon Reflexes: Reflexes are normal and symmetric.  Psychiatric:        Behavior: Behavior normal.        Thought Content: Thought content normal.        Judgment: Judgment normal.     BP 133/83   Pulse 66   Ht 5' 6 (1.676 m)   Wt 175 lb (79.4 kg)   SpO2 99%   BMI 28.25 kg/m        Assessment & Plan:   Heather Mcclain comes in today with chief complaint of Medical Management of Chronic Issues   Diagnosis and orders addressed:  1. Hypertension associated with diabetes (HCC) (Primary) Dash diet - CBC with Differential/Platelet - CMP14+EGFR - Lipid panel - losartan  (COZAAR ) 50 MG tablet; Take 1 tablet (50 mg total) by mouth daily.  Dispense: 90 tablet; Refill: 1  2. Type 2 diabetes mellitus with other specified complication, without long-term current use of insulin (HCC) Continue to watchh carb sin diet - Bayer DCA Hb A1c Waived - Vitamin B12 - metFORMIN  (GLUCOPHAGE -XR) 500 MG 24 hr tablet; Take 1 tablet (500 mg total) by mouth daily with breakfast.  Dispense: 90 tablet; Refill: 1  3. Acute idiopathic gout involving toe of right foot Low purine diet  4. Vitamin D  deficiency Daily vitamin d  supplement  5. BMI 29.0-29.9,adult Discussed diet and exercise for person with BMI >25 Will recheck weight in 3-6 months   6. Colon cancer screening Referral for colonoscopy - Ambulatory referral to Gastroenterology   Labs pending Health Maintenance reviewed Diet and exercise encouraged  Follow up plan: 6 months   Mary-Margaret  Gladis, FNP     [1]  Allergies Allergen Reactions   Penicillins Rash   "

## 2024-08-12 NOTE — Patient Instructions (Signed)
 Exercising to Stay Healthy To become healthy and stay healthy, it is recommended that you do moderate-intensity and vigorous-intensity exercise. You can tell that you are exercising at a moderate intensity if your heart starts beating faster and you start breathing faster but can still hold a conversation. You can tell that you are exercising at a vigorous intensity if you are breathing much harder and faster and cannot hold a conversation while exercising. How can exercise benefit me? Exercising regularly is important. It has many health benefits, such as: Improving overall fitness, flexibility, and endurance. Increasing bone density. Helping with weight control. Decreasing body fat. Increasing muscle strength and endurance. Reducing stress and tension, anxiety, depression, or anger. Improving overall health. What guidelines should I follow while exercising? Before you start a new exercise program, talk with your health care provider. Do not exercise so much that you hurt yourself, feel dizzy, or get very short of breath. Wear comfortable clothes and wear shoes with good support. Drink plenty of water while you exercise to prevent dehydration or heat stroke. Work out until your breathing and your heartbeat get faster (moderate intensity). How often should I exercise? Choose an activity that you enjoy, and set realistic goals. Your health care provider can help you make an activity plan that is individually designed and works best for you. Exercise regularly as told by your health care provider. This may include: Doing strength training two times a week, such as: Lifting weights. Using resistance bands. Push-ups. Sit-ups. Yoga. Doing a certain intensity of exercise for a given amount of time. Choose from these options: A total of 150 minutes of moderate-intensity exercise every week. A total of 75 minutes of vigorous-intensity exercise every week. A mix of moderate-intensity and  vigorous-intensity exercise every week. Children, pregnant women, people who have not exercised regularly, people who are overweight, and older adults may need to talk with a health care provider about what activities are safe to perform. If you have a medical condition, be sure to talk with your health care provider before you start a new exercise program. What are some exercise ideas? Moderate-intensity exercise ideas include: Walking 1 mile (1.6 km) in about 15 minutes. Biking. Hiking. Golfing. Dancing. Water aerobics. Vigorous-intensity exercise ideas include: Walking 4.5 miles (7.2 km) or more in about 1 hour. Jogging or running 5 miles (8 km) in about 1 hour. Biking 10 miles (16.1 km) or more in about 1 hour. Lap swimming. Roller-skating or in-line skating. Cross-country skiing. Vigorous competitive sports, such as football, basketball, and soccer. Jumping rope. Aerobic dancing. What are some everyday activities that can help me get exercise? Yard work, such as: Child psychotherapist. Raking and bagging leaves. Washing your car. Pushing a stroller. Shoveling snow. Gardening. Washing windows or floors. How can I be more active in my day-to-day activities? Use stairs instead of an elevator. Take a walk during your lunch break. If you drive, park your car farther away from your work or school. If you take public transportation, get off one stop early and walk the rest of the way. Stand up or walk around during all of your indoor phone calls. Get up, stretch, and walk around every 30 minutes throughout the day. Enjoy exercise with a friend. Support to continue exercising will help you keep a regular routine of activity. Where to find more information You can find more information about exercising to stay healthy from: [place] Department of Health and Human Services: ThisPath.fi Centers for Disease Control and Prevention (  CDC): [employer].br Summary Exercising regularly is  important. It will improve your overall fitness, flexibility, and endurance. Regular exercise will also improve your overall health. It can help you control your weight, reduce stress, and improve your bone density. Do not exercise so much that you hurt yourself, feel dizzy, or get very short of breath. Before you start a new exercise program, talk with your health care provider. This information is not intended to replace advice given to you by your health care provider. Make sure you discuss any questions you have with your health care provider. Document Revised: 10/20/2020 Document Reviewed: 10/20/2020 Elsevier Patient Education  2024 [employer].

## 2024-08-13 ENCOUNTER — Ambulatory Visit: Payer: Self-pay | Admitting: Nurse Practitioner

## 2024-08-13 LAB — CMP14+EGFR
ALT: 15 [IU]/L (ref 0–32)
AST: 20 [IU]/L (ref 0–40)
Albumin: 4.5 g/dL (ref 3.9–4.9)
Alkaline Phosphatase: 138 [IU]/L — ABNORMAL HIGH (ref 49–135)
BUN/Creatinine Ratio: 13 (ref 12–28)
BUN: 10 mg/dL (ref 8–27)
Bilirubin Total: 0.4 mg/dL (ref 0.0–1.2)
CO2: 23 mmol/L (ref 20–29)
Calcium: 10.1 mg/dL (ref 8.7–10.3)
Chloride: 103 mmol/L (ref 96–106)
Creatinine, Ser: 0.8 mg/dL (ref 0.57–1.00)
Globulin, Total: 2.6 g/dL (ref 1.5–4.5)
Glucose: 93 mg/dL (ref 70–99)
Potassium: 4.6 mmol/L (ref 3.5–5.2)
Sodium: 142 mmol/L (ref 134–144)
Total Protein: 7.1 g/dL (ref 6.0–8.5)
eGFR: 83 mL/min/{1.73_m2}

## 2024-08-13 LAB — CBC WITH DIFFERENTIAL/PLATELET
Basophils Absolute: 0 10*3/uL (ref 0.0–0.2)
Basos: 1 %
EOS (ABSOLUTE): 0.2 10*3/uL (ref 0.0–0.4)
Eos: 5 %
Hematocrit: 42.4 % (ref 34.0–46.6)
Hemoglobin: 13.5 g/dL (ref 11.1–15.9)
Immature Grans (Abs): 0 10*3/uL (ref 0.0–0.1)
Immature Granulocytes: 0 %
Lymphocytes Absolute: 1.8 10*3/uL (ref 0.7–3.1)
Lymphs: 40 %
MCH: 26.8 pg (ref 26.6–33.0)
MCHC: 31.8 g/dL (ref 31.5–35.7)
MCV: 84 fL (ref 79–97)
Monocytes Absolute: 0.5 10*3/uL (ref 0.1–0.9)
Monocytes: 10 %
Neutrophils Absolute: 2 10*3/uL (ref 1.4–7.0)
Neutrophils: 44 %
Platelets: 251 10*3/uL (ref 150–450)
RBC: 5.03 x10E6/uL (ref 3.77–5.28)
RDW: 14.5 % (ref 11.7–15.4)
WBC: 4.4 10*3/uL (ref 3.4–10.8)

## 2024-08-13 LAB — VITAMIN B12: Vitamin B-12: 596 pg/mL (ref 232–1245)

## 2024-08-13 LAB — LIPID PANEL
Chol/HDL Ratio: 2 ratio (ref 0.0–4.4)
Cholesterol, Total: 206 mg/dL — ABNORMAL HIGH (ref 100–199)
HDL: 101 mg/dL
LDL Chol Calc (NIH): 90 mg/dL (ref 0–99)
Triglycerides: 84 mg/dL (ref 0–149)
VLDL Cholesterol Cal: 15 mg/dL (ref 5–40)

## 2025-02-08 ENCOUNTER — Ambulatory Visit: Payer: Self-pay | Admitting: Nurse Practitioner
